# Patient Record
Sex: Female | Born: 1935 | Race: White | Hispanic: No | Marital: Single | Smoking: Former smoker
Health system: Southern US, Community
[De-identification: ages and names within clinical notes are randomized; demographics above are authoritative.]

## PROBLEM LIST (undated history)

## (undated) DIAGNOSIS — R609 Edema, unspecified: Secondary | ICD-10-CM

## (undated) DIAGNOSIS — I10 Essential (primary) hypertension: Secondary | ICD-10-CM

## (undated) DIAGNOSIS — J189 Pneumonia, unspecified organism: Secondary | ICD-10-CM

## (undated) DIAGNOSIS — R600 Localized edema: Secondary | ICD-10-CM

## (undated) DIAGNOSIS — I4891 Unspecified atrial fibrillation: Secondary | ICD-10-CM

## (undated) DIAGNOSIS — F0390 Unspecified dementia without behavioral disturbance: Secondary | ICD-10-CM

## (undated) HISTORY — PX: ABDOMINAL HYSTERECTOMY: SHX81

## (undated) HISTORY — PX: CHOLECYSTECTOMY: SHX55

## (undated) HISTORY — PX: EYE SURGERY: SHX253

## (undated) HISTORY — PX: BREAST SURGERY: SHX581

## (undated) HISTORY — PX: APPENDECTOMY: SHX54

## (undated) HISTORY — PX: FRACTURE SURGERY: SHX138

---

## 2004-06-23 ENCOUNTER — Other Ambulatory Visit: Payer: Self-pay

## 2006-07-15 ENCOUNTER — Ambulatory Visit: Payer: Self-pay | Admitting: Internal Medicine

## 2008-08-15 ENCOUNTER — Inpatient Hospital Stay: Payer: Self-pay | Admitting: Internal Medicine

## 2009-03-01 ENCOUNTER — Inpatient Hospital Stay: Payer: Self-pay | Admitting: Internal Medicine

## 2010-03-14 ENCOUNTER — Inpatient Hospital Stay: Payer: Self-pay | Admitting: Orthopedic Surgery

## 2010-03-20 ENCOUNTER — Encounter: Payer: Self-pay | Admitting: Internal Medicine

## 2010-04-13 ENCOUNTER — Encounter: Payer: Self-pay | Admitting: Internal Medicine

## 2010-05-14 ENCOUNTER — Encounter: Payer: Self-pay | Admitting: Internal Medicine

## 2010-06-14 ENCOUNTER — Encounter: Payer: Self-pay | Admitting: Internal Medicine

## 2010-07-14 ENCOUNTER — Encounter: Payer: Self-pay | Admitting: Internal Medicine

## 2010-08-14 ENCOUNTER — Ambulatory Visit: Payer: Self-pay

## 2010-10-25 ENCOUNTER — Ambulatory Visit: Payer: Self-pay | Admitting: Orthopedic Surgery

## 2010-10-30 ENCOUNTER — Inpatient Hospital Stay: Payer: Self-pay | Admitting: Orthopedic Surgery

## 2010-11-02 ENCOUNTER — Encounter: Payer: Self-pay | Admitting: Internal Medicine

## 2010-11-14 ENCOUNTER — Encounter: Payer: Self-pay | Admitting: Internal Medicine

## 2010-11-14 ENCOUNTER — Inpatient Hospital Stay: Payer: Self-pay | Admitting: Surgery

## 2010-11-14 ENCOUNTER — Ambulatory Visit: Payer: Self-pay

## 2010-11-19 ENCOUNTER — Encounter: Payer: Self-pay | Admitting: Internal Medicine

## 2010-12-13 ENCOUNTER — Encounter: Payer: Self-pay | Admitting: Internal Medicine

## 2010-12-16 ENCOUNTER — Emergency Department: Payer: Self-pay | Admitting: Emergency Medicine

## 2011-01-13 ENCOUNTER — Encounter: Payer: Self-pay | Admitting: Internal Medicine

## 2011-02-12 ENCOUNTER — Encounter: Payer: Self-pay | Admitting: Internal Medicine

## 2011-03-15 ENCOUNTER — Encounter: Payer: Self-pay | Admitting: Internal Medicine

## 2011-04-14 ENCOUNTER — Encounter: Payer: Self-pay | Admitting: Internal Medicine

## 2011-05-15 ENCOUNTER — Encounter: Payer: Self-pay | Admitting: Internal Medicine

## 2011-10-28 ENCOUNTER — Emergency Department: Payer: Self-pay | Admitting: *Deleted

## 2011-10-28 LAB — COMPREHENSIVE METABOLIC PANEL
Anion Gap: 11 (ref 7–16)
BUN: 18 mg/dL (ref 7–18)
Bilirubin,Total: 0.4 mg/dL (ref 0.2–1.0)
Chloride: 97 mmol/L — ABNORMAL LOW (ref 98–107)
Creatinine: 0.84 mg/dL (ref 0.60–1.30)
EGFR (Non-African Amer.): >60
Potassium: 4.4 mmol/L (ref 3.5–5.1)
Sodium: 137 mmol/L (ref 136–145)
Total Protein: 7.7 g/dL (ref 6.4–8.2)

## 2011-10-28 LAB — CBC
HCT: 41 % (ref 35.0–47.0)
MCH: 31.2 pg (ref 26.0–34.0)
MCHC: 33.2 g/dL (ref 32.0–36.0)
Platelet: 226 10*3/uL (ref 150–440)
RDW: 13.5 % (ref 11.5–14.5)
WBC: 8.8 10*3/uL (ref 3.6–11.0)

## 2011-10-28 LAB — URINALYSIS, COMPLETE
Bilirubin,UR: NEGATIVE
Ph: 6 (ref 4.5–8.0)
Protein: NEGATIVE
RBC,UR: 2 /HPF (ref 0–5)
Squamous Epithelial: 6

## 2011-10-28 LAB — LIPASE, BLOOD: Lipase: 141 U/L (ref 73–393)

## 2013-10-15 LAB — COMPREHENSIVE METABOLIC PANEL
ALBUMIN: 2.7 g/dL — AB (ref 3.4–5.0)
ALK PHOS: 51 U/L
Anion Gap: 6 — ABNORMAL LOW (ref 7–16)
BUN: 17 mg/dL (ref 7–18)
Bilirubin,Total: 0.9 mg/dL (ref 0.2–1.0)
CHLORIDE: 97 mmol/L — AB (ref 98–107)
CREATININE: 0.72 mg/dL (ref 0.60–1.30)
Calcium, Total: 8.5 mg/dL (ref 8.5–10.1)
Co2: 27 mmol/L (ref 21–32)
EGFR (African American): >60
GLUCOSE: 100 mg/dL — AB (ref 65–99)
OSMOLALITY: 262 (ref 275–301)
Potassium: 3.8 mmol/L (ref 3.5–5.1)
SGOT(AST): 44 U/L — ABNORMAL HIGH (ref 15–37)
SGPT (ALT): 22 U/L (ref 12–78)
Sodium: 130 mmol/L — ABNORMAL LOW (ref 136–145)
Total Protein: 6.9 g/dL (ref 6.4–8.2)

## 2013-10-15 LAB — MAGNESIUM: MAGNESIUM: 1.8 mg/dL

## 2013-10-15 LAB — LIPASE, BLOOD: LIPASE: 68 U/L — AB (ref 73–393)

## 2013-10-15 LAB — TROPONIN I: Troponin-I: <0.02 ng/mL

## 2013-10-16 ENCOUNTER — Inpatient Hospital Stay: Payer: Self-pay | Admitting: Internal Medicine

## 2013-10-16 LAB — URINALYSIS, COMPLETE
Bilirubin,UR: NEGATIVE
Glucose,UR: NEGATIVE mg/dL (ref 0–75)
KETONE: NEGATIVE
Leukocyte Esterase: NEGATIVE
Nitrite: NEGATIVE
Ph: 6 (ref 4.5–8.0)
Protein: 30
RBC,UR: 68 /HPF (ref 0–5)
Specific Gravity: 1.023 (ref 1.003–1.030)
Squamous Epithelial: 1
WBC UR: 3 /HPF (ref 0–5)

## 2013-10-16 LAB — CBC WITH DIFFERENTIAL/PLATELET
Basophil #: 0.1 10*3/uL (ref 0.0–0.1)
Basophil %: 0.5 %
EOS ABS: 0.1 10*3/uL (ref 0.0–0.7)
EOS PCT: 0.9 %
HCT: 32.6 % — AB (ref 35.0–47.0)
HGB: 11 g/dL — AB (ref 12.0–16.0)
LYMPHS ABS: 1.6 10*3/uL (ref 1.0–3.6)
Lymphocyte %: 11.8 %
MCH: 30.6 pg (ref 26.0–34.0)
MCHC: 33.8 g/dL (ref 32.0–36.0)
MCV: 91 fL (ref 80–100)
MONOS PCT: 12.1 %
Monocyte #: 1.6 x10 3/mm — ABNORMAL HIGH (ref 0.2–0.9)
NEUTROS ABS: 9.9 10*3/uL — AB (ref 1.4–6.5)
Neutrophil %: 74.7 %
PLATELETS: 229 10*3/uL (ref 150–440)
RBC: 3.6 10*6/uL — ABNORMAL LOW (ref 3.80–5.20)
RDW: 13.9 % (ref 11.5–14.5)
WBC: 13.2 10*3/uL — AB (ref 3.6–11.0)

## 2013-10-16 LAB — PROTIME-INR
INR: 1.2
Prothrombin Time: 14.6 secs (ref 11.5–14.7)

## 2013-10-17 LAB — CBC WITH DIFFERENTIAL/PLATELET
BASOS PCT: 0.4 %
Basophil #: 0.1 10*3/uL (ref 0.0–0.1)
EOS ABS: 0.2 10*3/uL (ref 0.0–0.7)
Eosinophil %: 1.9 %
HCT: 32.5 % — AB (ref 35.0–47.0)
HGB: 10.8 g/dL — AB (ref 12.0–16.0)
LYMPHS ABS: 1.3 10*3/uL (ref 1.0–3.6)
Lymphocyte %: 11.4 %
MCH: 30.3 pg (ref 26.0–34.0)
MCHC: 33.3 g/dL (ref 32.0–36.0)
MCV: 91 fL (ref 80–100)
Monocyte #: 1.2 x10 3/mm — ABNORMAL HIGH (ref 0.2–0.9)
Monocyte %: 10.5 %
Neutrophil #: 8.8 10*3/uL — ABNORMAL HIGH (ref 1.4–6.5)
Neutrophil %: 75.8 %
Platelet: 260 10*3/uL (ref 150–440)
RBC: 3.57 10*6/uL — AB (ref 3.80–5.20)
RDW: 14.3 % (ref 11.5–14.5)
WBC: 11.6 10*3/uL — AB (ref 3.6–11.0)

## 2013-10-17 LAB — COMPREHENSIVE METABOLIC PANEL
AST: 22 U/L (ref 15–37)
Albumin: 2.5 g/dL — ABNORMAL LOW (ref 3.4–5.0)
Alkaline Phosphatase: 50 U/L
Anion Gap: 3 — ABNORMAL LOW (ref 7–16)
BUN: 11 mg/dL (ref 7–18)
Bilirubin,Total: 0.6 mg/dL (ref 0.2–1.0)
Calcium, Total: 8.1 mg/dL — ABNORMAL LOW (ref 8.5–10.1)
Chloride: 102 mmol/L (ref 98–107)
Co2: 27 mmol/L (ref 21–32)
Creatinine: 0.77 mg/dL (ref 0.60–1.30)
EGFR (African American): >60
EGFR (Non-African Amer.): >60
Glucose: 99 mg/dL (ref 65–99)
Osmolality: 264 (ref 275–301)
Potassium: 3 mmol/L — ABNORMAL LOW (ref 3.5–5.1)
SGPT (ALT): 20 U/L (ref 12–78)
SODIUM: 132 mmol/L — AB (ref 136–145)
TOTAL PROTEIN: 6.4 g/dL (ref 6.4–8.2)

## 2013-10-18 LAB — ELECTROLYTE PANEL
Anion Gap: 9 (ref 7–16)
CO2: 20 mmol/L — AB (ref 21–32)
Chloride: 103 mmol/L (ref 98–107)
POTASSIUM: 3.9 mmol/L (ref 3.5–5.1)
Sodium: 132 mmol/L — ABNORMAL LOW (ref 136–145)

## 2013-10-19 LAB — ELECTROLYTE PANEL
Anion Gap: 6 — ABNORMAL LOW (ref 7–16)
CHLORIDE: 101 mmol/L (ref 98–107)
CO2: 26 mmol/L (ref 21–32)
Potassium: 3 mmol/L — ABNORMAL LOW (ref 3.5–5.1)
SODIUM: 133 mmol/L — AB (ref 136–145)

## 2013-10-20 LAB — CREATININE, SERUM
Creatinine: 0.71 mg/dL (ref 0.60–1.30)
EGFR (Non-African Amer.): >60

## 2013-10-20 LAB — ELECTROLYTE PANEL
Anion Gap: 6 — ABNORMAL LOW (ref 7–16)
CO2: 26 mmol/L (ref 21–32)
Chloride: 103 mmol/L (ref 98–107)
Potassium: 3.6 mmol/L (ref 3.5–5.1)
Sodium: 135 mmol/L — ABNORMAL LOW (ref 136–145)

## 2013-10-20 LAB — WBC: WBC: 12.9 10*3/uL — ABNORMAL HIGH (ref 3.6–11.0)

## 2013-10-20 LAB — PLATELET COUNT: PLATELETS: 310 10*3/uL (ref 150–440)

## 2013-10-21 LAB — CULTURE, BLOOD (SINGLE)

## 2013-10-21 LAB — ELECTROLYTE PANEL
Anion Gap: 6 — ABNORMAL LOW (ref 7–16)
CO2: 28 mmol/L (ref 21–32)
Chloride: 102 mmol/L (ref 98–107)
Potassium: 3.5 mmol/L (ref 3.5–5.1)
Sodium: 136 mmol/L (ref 136–145)

## 2015-02-04 NOTE — H&P (Signed)
PATIENT NAME:  Shannon Williams, Shannon Williams MR#:  [id number] DATE OF BIRTH:  Feb 26, 1922  DATE OF ADMISSION:  10/16/2013  REFERRING PHYSICIAN: Dr.  York Cerise.   PRIMARY CARE PHYSICIAN: Dr. Yates Decamp.   CHIEF COMPLAINT: Altered mental status.  HISTORY OF PRESENT ILLNESS:  This is an 81 year old Caucasian female with past medical history of atrial fibrillation as well as dementia, presenting with altered mental status. Her family describes her altered mental status described as lethargy and decreased responsiveness. Apparently, her symptoms began approximately one week ago with a cough productive of yellow sputum; however, no fevers or chills. Conversation with PCP, she should receive some antitussives medication, however, after receiving these medications, her mental status deteriorated and made her drowsy. The family says this is not an uncommon finding for this patient. She has had multiple instances where she received medications and became minimally responsive, however, after the symptoms persisted for about two days in duration, they were advised to bring her to the hospital for further workup and evaluation. The patient is unable to provide further information secondary to medical condition and mental status.   REVIEW OF SYSTEMS: Unobtainable secondary to medical condition and mental status.   PAST MEDICAL HISTORY:  Of dementia, according to family, at baseline, atrial fibrillation, hypertension, hyperlipidemia.   SOCIAL HISTORY: No alcohol, tobacco or drug usage. She has been bedridden for approximately three years in total, baseline mental status. She is able to carry on conversations without difficulties. Occasional minimal confusion.   FAMILY HISTORY: Positive for coronary artery disease. Denies any known neurological disorders.   ALLERGIES: BETA BLOCKERS, CODEINE, ERYTHROMYCIN, AS WELL AS MORPHINE.   HOME MEDICATIONS: Aspirin 325 mg p.o. daily, calcium 500 + D 1 tablet p.o. daily, citalopram 10 mg  p.o. daily, diltiazem 120 mg extended release p.o. daily, Colace 50 mg daily as needed for constipation, Lasix 20 mg p.o. daily, Telmisartan 40 mg p.o. daily.   PHYSICAL EXAMINATION: VITAL SIGNS: Temperature 99.3 heart rate 132, respirations 22, blood pressure 120/67, saturating 93% on supplemental O2. Weight 77.1 kg, BMI 27.5.  GENERAL: Chronically ill-appearing Caucasian female, currently in minimal to moderate distress given mental status.  HEAD: Normocephalic, atraumatic.  EYES: Pupils equal, round and reactive to light. Extraocular muscles intact. No scleral icterus.  MOUTH: Dry mucosal membrane. Poor dentition. No abscess noted.  EARS, NOSE, THROAT: Throat clear without exudates. No external lesions.  NECK: Supple. No thyromegaly. No nodules. No JVD.  PULMONARY: Scant coarse rhonchi over the left with decreased breath sounds bilaterally. No use of accessory muscles. Good respiratory effort.  CHEST: Nontender to palpation.  CARDIOVASCULAR: S1, S2, irregular rate, irregular rhythm, tachycardic. No murmurs, rubs or gallops 2+ edema to the thighs bilaterally. Pedal pulses 2+ bilaterally.  GASTROINTESTINAL: Soft, nontender, nondistended. No masses. Positive bowel sounds. No hepatosplenomegaly.  MUSCULOSKELETAL: No swelling, clubbing, positive for edema as described above. Passive range of motion full in all extremities.  NEUROLOGIC: Unable to fully assess given the patient's medical condition and mental status.  SKIN: Bilateral medial portion of the lower extremities around the knees reveal multiple excoriations with bullae as well as surrounding erythema and edema. No further ulcerations, lesions, rash or cyanosis. Skin is warm and dry. Skin turgor is intact.  PSYCHIATRIC: Blunted, unable to fully assess, given the patient's medical condition and mental status. She occasionally moans, but does not provide any meaningful information.   LABORATORY DATA: Sodium 130, potassium 3.8, chloride 97,  bicarb 27, BUN 17, creatinine 0.72, glucose 100. LFTs: Albumin of 2.7,  remainder normal limits. WBC 13.2, neutrophil predominant at 9.9, hemoglobin 11, platelets 229. Urinalysis, WBCs 3, RBCs 68. Otherwise, no indication of infection. Chest x-ray performed revealing retrocardiac opacity.   ASSESSMENT AND PLAN: An 81 year old female with history of atrial fibrillation and dementia, presenting with altered mental status, described as lethargy, found to be in atrial fibrillation with ventricular response as well as retrocardiac pneumonia.  1.  Acute encephalopathy, avoid further sedating agents. This is likely secondary to medication side effects further complicated by her acute illness.  2.  Sepsis, meeting septic criteria via white count and heart rate. Evidence points to pulmonary source: Pan cultures and antibiotic coverage with Levaquin and IV fluids to maintain mean arterial pressure greater than 65. Provide DuoNeb therapy q.4 hours as well as supplemental O2 to maintain SAO2 greater than 92%.  3.  Atrial fibrillation, rapid ventricular response. She did miss her Cardizem doses for about two days in total, though she is unable to tolerate p.o. at this time. We will start Cardizem drip, give 10 mg IV x 1 and start drip at 5 mg an hour for rate control and adjust accordingly and bolus in increased fashion if necessary.  4.  Deep venous thrombosis prophylaxis. She is on heparin subcutaneous.  5.  CODE STATUS: The patient is a DO NOT RESUSCITATE per her daughters at bedside, however, she will need Intensive Care Unit bed given Cardizem drip.   Critical care time spent: 55 minutes.     ____________________________ Cletis Athensavid K. Hower, MD dkh:NTS D: 10/16/2013 01:55:50 ET T: 10/16/2013 02:54:00 ET JOB#: [id number]  cc: Cletis Athensavid K. Hower, MD, <Dictator> DAVID Synetta ShadowK HOWER MD ELECTRONICALLY SIGNED 10/16/2013 20:20

## 2015-02-04 NOTE — Discharge Summary (Signed)
PATIENT NAME:  Shannon Williams, Shannon Williams MR#:  [id number] DATE OF BIRTH:  09-16-1922  DATE OF ADMISSION:  10/16/2013 DATE OF DISCHARGE:  10/22/2013  Shannon Williams was an 81 year old, demented lady brought in by her daughters because of altered mental status, progressive shortness of breath, cough and fever.   PAST MEDICAL HISTORY: Notable for senile dementia, chronic atrial fibrillation, hypertension and hyperlipidemia.   ALLERGIES: THE PATIENT HAS A HISTORY OF INTOLERANCE TO BETA BLOCKERS, CODEINE, ERYTHROMYCIN AND MORPHINE.   MEDICATIONS ON ADMISSION: Included aspirin 325 mg daily, calcium 500 mg plus vitamin D 1 tablet daily, Celexa 10 mg daily, diltiazem extended-release 120 mg daily, Colace 50 mg daily, Lasix 20 mg daily and telmisartan 40 mg daily.   ADMISSION PHYSICAL EXAMINATION: Revealed: VITAL SIGNS: Temperature 99.3, heart rate of 132, respirations 22 and blood pressure 120/67. Pulse oximetry on oxygen was 93%.  GENERAL: The patient looked both acutely and chronically ill. She was in moderate respiratory distress.  HEENT: She had dry mucous membranes and poor dentition.  LUNGS: She had decreased breath sounds bilaterally. Rhonchi were heard over the left lung field.  HEART: Revealed an irregular tachycardia without murmurs or gallops. She did have 2+ edema. EXTREMITIES: The patient had stasis cellulitis of the lower extremities.  LABS: Admission CBC showed a hemoglobin of 11 with a hematocrit of 32.6. Platelet count was 229,000. White count was 13,200. Admission comprehensive metabolic panel was notable for a sodium of 130 and a chloride of 97. SGOT was 44. Albumin was 2.7. Lipase was 68. Magnesium was 1.8. Troponin was less than 0.02. Urinalysis on admission showed 2+ blood on the dipstick plus 30 mg/dL of protein. Microscopic exam showed 68 red cells per high-power field. Blood cultures drawn on admission eventually showed no growth. Admission EKG showed atrial fibrillation with a rapid  ventricular response. Admission chest x-ray showed a retrocardiac density that was felt most likely to be pneumonia. There was also peribronchial thickening bilaterally.   HOSPITAL COURSE: The patient was admitted to the regular medical floor where she was placed on telemetry. She was placed on a pulmonary toilet of IV steroids, IV antibiotics and SVNs. Her atrial fibrillation was felt to be due to the fact she has missed her medication prior to admission for at least 2 days. She was initially started on a Cardizem drip, but was switched to oral Cardizem as soon as possible. The patient did show slow, but gradual improvement. Her heart rate was brought under control. Her final chemistry panel showed a sodium of 136 with a potassium of 3.5 and a chloride of 102. Followup chest x-ray showed a small left pleural effusion. There was left interstitial thickening with left basilar airspace disease. This was felt to be persistent pneumonia versus atelectasis. The patient was seen by physical therapy, who tried to increase her strength during her hospital stay.   DISCHARGE DIAGNOSES:  1.  Sepsis due to acute community-acquired pneumonia.  2.  Rapid atrial fibrillation, resolved.  3.  Hypertension.  4.  Senile dementia.  5.  Microscopic hematuria.   DISCHARGE MEDICATIONS:  1.  Telmisartan 40 mg daily.  2.  Furosemide 20 mg daily.  3.  Celexa 10 mg daily.  4.  Tessalon Perles 100 mg 3 times a day as needed.  5.  Colace 50 mg daily as needed.  6.  Aspirin 325 mg daily.  7.  Diltiazem extended release 240 mg daily.  8.  Levaquin 500 mg once a day for an additional 7 days.  9.  Calcium 500 mg plus vitamin D 1 tablet daily.   DISCHARGE INSTRUCTIONS: The patient was discharged on a low sodium diet with activity as tolerated. The patient's pulse oximetry at the time of discharge was normal and she did not require home oxygen. The patient is to be followed up in the office in 1 to 2 weeks.   ____________________________ Letta PateJohn B. Danne HarborWalker III, MD jbw:aw D: 11/08/2013 07:38:14 ET T: 11/08/2013 07:53:45 ET JOB#: [id number]  cc: Jonny RuizJohn B. Danne HarborWalker III, MD, <Dictator> Elmo PuttJOHN B WALKER III MD ELECTRONICALLY SIGNED 11/08/2013 18:44

## 2015-02-26 ENCOUNTER — Emergency Department: Payer: Commercial Managed Care - HMO

## 2015-02-26 ENCOUNTER — Inpatient Hospital Stay
Admission: EM | Admit: 2015-02-26 | Discharge: 2015-03-01 | DRG: 689 | Disposition: A | Discharge status: Skilled Nursing Facility | Payer: Commercial Managed Care - HMO | Attending: Internal Medicine | Admitting: Internal Medicine

## 2015-02-26 ENCOUNTER — Other Ambulatory Visit: Payer: Self-pay

## 2015-02-26 ENCOUNTER — Encounter: Payer: Self-pay | Admitting: Emergency Medicine

## 2015-02-26 DIAGNOSIS — Z7401 Bed confinement status: Secondary | ICD-10-CM | POA: Diagnosis not present

## 2015-02-26 DIAGNOSIS — Z7982 Long term (current) use of aspirin: Secondary | ICD-10-CM | POA: Diagnosis not present

## 2015-02-26 DIAGNOSIS — I482 Chronic atrial fibrillation, unspecified: Secondary | ICD-10-CM | POA: Diagnosis present

## 2015-02-26 DIAGNOSIS — R4182 Altered mental status, unspecified: Secondary | ICD-10-CM

## 2015-02-26 DIAGNOSIS — I10 Essential (primary) hypertension: Secondary | ICD-10-CM | POA: Diagnosis present

## 2015-02-26 DIAGNOSIS — G9340 Encephalopathy, unspecified: Secondary | ICD-10-CM | POA: Diagnosis present

## 2015-02-26 DIAGNOSIS — I4891 Unspecified atrial fibrillation: Secondary | ICD-10-CM

## 2015-02-26 DIAGNOSIS — Z87891 Personal history of nicotine dependence: Secondary | ICD-10-CM | POA: Diagnosis not present

## 2015-02-26 DIAGNOSIS — N390 Urinary tract infection, site not specified: Secondary | ICD-10-CM | POA: Diagnosis not present

## 2015-02-26 DIAGNOSIS — Z66 Do not resuscitate: Secondary | ICD-10-CM | POA: Diagnosis present

## 2015-02-26 DIAGNOSIS — Z885 Allergy status to narcotic agent status: Secondary | ICD-10-CM | POA: Diagnosis not present

## 2015-02-26 DIAGNOSIS — A419 Sepsis, unspecified organism: Principal | ICD-10-CM | POA: Diagnosis present

## 2015-02-26 DIAGNOSIS — Z79899 Other long term (current) drug therapy: Secondary | ICD-10-CM

## 2015-02-26 DIAGNOSIS — F0390 Unspecified dementia without behavioral disturbance: Secondary | ICD-10-CM | POA: Diagnosis present

## 2015-02-26 DIAGNOSIS — Z882 Allergy status to sulfonamides status: Secondary | ICD-10-CM

## 2015-02-26 DIAGNOSIS — I739 Peripheral vascular disease, unspecified: Secondary | ICD-10-CM | POA: Diagnosis present

## 2015-02-26 HISTORY — DX: Localized edema: R60.0

## 2015-02-26 HISTORY — DX: Essential (primary) hypertension: I10

## 2015-02-26 HISTORY — DX: Unspecified atrial fibrillation: I48.91

## 2015-02-26 HISTORY — DX: Edema, unspecified: R60.9

## 2015-02-26 HISTORY — DX: Unspecified dementia, unspecified severity, without behavioral disturbance, psychotic disturbance, mood disturbance, and anxiety: F03.90

## 2015-02-26 HISTORY — DX: Pneumonia, unspecified organism: J18.9

## 2015-02-26 LAB — CBC WITH DIFFERENTIAL/PLATELET
Basophils Absolute: 0.1 10*3/uL (ref 0–0.1)
Basophils Relative: 1 %
Eosinophils Absolute: 0 10*3/uL (ref 0–0.7)
Eosinophils Relative: 0 %
HEMATOCRIT: 37.4 % (ref 35.0–47.0)
Hemoglobin: 12.4 g/dL (ref 12.0–16.0)
LYMPHS ABS: 1.8 10*3/uL (ref 1.0–3.6)
Lymphocytes Relative: 8 %
MCH: 30.6 pg (ref 26.0–34.0)
MCHC: 33.3 g/dL (ref 32.0–36.0)
MCV: 91.8 fL (ref 80.0–100.0)
Monocytes Absolute: 1.4 10*3/uL — ABNORMAL HIGH (ref 0.2–0.9)
Monocytes Relative: 6 %
NEUTROS ABS: 18.2 10*3/uL — AB (ref 1.4–6.5)
NEUTROS PCT: 85 %
PLATELETS: 250 10*3/uL (ref 150–440)
RBC: 4.07 MIL/uL (ref 3.80–5.20)
RDW: 13.8 % (ref 11.5–14.5)
WBC: 21.4 10*3/uL — ABNORMAL HIGH (ref 3.6–11.0)

## 2015-02-26 LAB — URINALYSIS COMPLETE WITH MICROSCOPIC (ARMC ONLY)
Bilirubin Urine: NEGATIVE
Glucose, UA: NEGATIVE mg/dL
KETONES UR: NEGATIVE mg/dL
Leukocytes, UA: NEGATIVE
Nitrite: NEGATIVE
Protein, ur: 30 mg/dL — AB
Specific Gravity, Urine: 1.015 (ref 1.005–1.030)
pH: 6 (ref 5.0–8.0)

## 2015-02-26 LAB — COMPREHENSIVE METABOLIC PANEL
ALBUMIN: 3.6 g/dL (ref 3.5–5.0)
ALT: 13 U/L — AB (ref 14–54)
AST: 21 U/L (ref 15–41)
Alkaline Phosphatase: 48 U/L (ref 38–126)
Anion gap: 10 (ref 5–15)
BUN: 14 mg/dL (ref 6–20)
CHLORIDE: 98 mmol/L — AB (ref 101–111)
CO2: 28 mmol/L (ref 22–32)
Calcium: 9.3 mg/dL (ref 8.9–10.3)
Creatinine, Ser: 0.67 mg/dL (ref 0.44–1.00)
GFR calc non Af Amer: >60 mL/min (ref >60)
Glucose, Bld: 127 mg/dL — ABNORMAL HIGH (ref 65–99)
Potassium: 3.5 mmol/L (ref 3.5–5.1)
SODIUM: 136 mmol/L (ref 135–145)
Total Bilirubin: 0.6 mg/dL (ref 0.3–1.2)
Total Protein: 7.2 g/dL (ref 6.5–8.1)

## 2015-02-26 LAB — TROPONIN I

## 2015-02-26 LAB — LACTIC ACID, PLASMA: LACTIC ACID, VENOUS: 1.6 mmol/L (ref 0.5–2.0)

## 2015-02-26 MED ORDER — DEXTROSE 5 % IV SOLN
INTRAVENOUS | Status: AC
  Administered 2015-02-26: 1 g via INTRAVENOUS
  Filled 2015-02-26: qty 10

## 2015-02-26 MED ORDER — DILTIAZEM HCL 25 MG/5ML IV SOLN
INTRAVENOUS | Status: AC
  Administered 2015-02-26: 10 mg via INTRAVENOUS
  Filled 2015-02-26: qty 5

## 2015-02-26 MED ORDER — SODIUM CHLORIDE 0.9 % IV SOLN
Freq: Once | INTRAVENOUS | Status: AC
  Administered 2015-02-26: 21:00:00 via INTRAVENOUS

## 2015-02-26 MED ORDER — DILTIAZEM HCL 25 MG/5ML IV SOLN
10.0000 mg | Freq: Once | INTRAVENOUS | Status: AC
Start: 2015-02-26 — End: 2015-02-26
  Administered 2015-02-26: 10 mg via INTRAVENOUS

## 2015-02-26 MED ORDER — DILTIAZEM HCL 25 MG/5ML IV SOLN
20.0000 mg | Freq: Once | INTRAVENOUS | Status: AC
  Administered 2015-02-26: 20 mg via INTRAVENOUS

## 2015-02-26 MED ORDER — DILTIAZEM HCL ER COATED BEADS 240 MG PO CP24
240.0000 mg | ORAL_CAPSULE | Freq: Once | ORAL | Status: DC
  Filled 2015-02-26 (×2): qty 1

## 2015-02-26 MED ORDER — DILTIAZEM HCL 25 MG/5ML IV SOLN
INTRAVENOUS | Status: AC
  Administered 2015-02-26: 20 mg via INTRAVENOUS
  Filled 2015-02-26: qty 5

## 2015-02-26 MED ORDER — CEFTRIAXONE SODIUM 1 G IJ SOLR
1.0000 g | Freq: Once | INTRAMUSCULAR | Status: AC
  Administered 2015-02-26: 1 g via INTRAVENOUS

## 2015-02-26 NOTE — ED Notes (Signed)
Pt to rm 5 from home via ems.  Per EMS reports to with noted increased confusion this am at approx 7am.  Report pt with hx of UTI's.  Also report that earlier in the week pt experienced similar symptoms, pt assessed by home health nurse, pt was not transported to ED family decided it was a TIA.

## 2015-02-26 NOTE — ED Notes (Signed)
Family at bedside. Portable x-ray done.

## 2015-02-26 NOTE — H&P (Signed)
[facility] [place] [facility]   PATIENT NAME: Shannon RileyMargie Williams    MR#:  [id number]  DATE OF BIRTH:  05/11/1922  DATE OF ADMISSION:  02/26/2015  PRIMARY CARE PHYSICIAN: Rafael BihariWALKER III, [name], MD   REQUESTING/REFERRING PHYSICIAN: Daryel NovemberJonathan Williams  CHIEF COMPLAINT:   Chief Complaint  Patient presents with  . Altered Mental Status    HISTORY OF PRESENT ILLNESS:  Shannon Williams  is a 81 y.o. female with a known history of chronic atrial fibrillation, hypertension, dementia presents to the emergency room via EMS with the complaints of altered mental status since this morning. Patient is chronically bedbound and lives with her daughter at home and today morning she was noted to have increased confusion, hence brought to the emergency room for further evaluation. Denies any fever, chest pain, shortness of breath, focal weakness or numbness, nausea, vomiting, diarrhea, abdominal pain, dysuria. In the emergency room patient was evaluated by the ED physician and was noted to be in atrial fibrillation with a rapid ventricular rate of 142 bpm. Her blood pressure was stable. Lab work revealed significant leukocytosis with a WBC count of 21.4 with left shift, abnormal UA suggestive of UTI. Chest x-ray unremarkable for any acute cardiopulmonary pathology. CT head unremarkable for any acute intracranial process. Patient was given IV Cardizem 2 and at present her heart rate is still elevated but improving control. After sending the urine cultures patient was started on IV antibiotics namely ceftriaxone and hospitalist service was consulted for further management. Patient is on DO NOT RESUSCITATE status which is confirmed with her daughter who is her bedside at this time. Per patient's daughter, patient is more alert with improving mental status now. Does of her baseline dementia, I'm not able to get any history from the patient but according to the patient's daughter who is with the  patient's bedside at this time no history of fever or chills, chest pain,shortness of breath, focal weakness or numbness, nausea, vomiting, diarrhea, abdominal pain, dysuria. No history of recent reported illness.  PAST MEDICAL HISTORY:   Past Medical History  Diagnosis Date  . Atrial fibrillation   . Peripheral edema   . Hypertension   . Dementia     PAST SURGICAL HISTORY:   Past Surgical History  Procedure Laterality Date  . Cholecystectomy    . Abdominal hysterectomy    . Eye surgery    . Breast surgery    . Appendectomy    . Fracture surgery      SOCIAL HISTORY:   History  Substance Use Topics  . Smoking status: Former Games developermoker  . Smokeless tobacco: Not on file  . Alcohol Use: No    FAMILY HISTORY:   Family History  Problem Relation Age of Onset  . Heart Problems Mother   . Cancer Father   . Cancer - Lung Sister     DRUG ALLERGIES:   Allergies  Allergen Reactions  . Sulfa Antibiotics Itching  . Codeine Anxiety    REVIEW OF SYSTEMS:   Review of Systems  Unable to perform ROS: dementia    MEDICATIONS AT HOME:   Prior to Admission medications   Medication Sig Start Date End Date Taking? Authorizing Provider  acetaminophen (TYLENOL) 500 MG tablet Take 500 mg by mouth every 6 (six) hours as needed.   Yes Historical Provider, MD  aspirin 325 MG tablet Take 325 mg by mouth daily.   Yes Historical Provider, MD  calcium-vitamin D (OSCAL WITH D) 500-200 MG-UNIT  per tablet Take 1 tablet by mouth.   Yes Historical Provider, MD  citalopram (CELEXA) 10 MG tablet Take 10 mg by mouth daily.   Yes Historical Provider, MD  diltiazem (TIAZAC) 240 MG 24 hr capsule Take 240 mg by mouth daily.   Yes Historical Provider, MD  docusate sodium (COLACE) 50 MG capsule Take 50 mg by mouth 2 (two) times daily.   Yes Historical Provider, MD  furosemide (LASIX) 20 MG tablet Take 20 mg by mouth.   Yes Historical Provider, MD  losartan (COZAAR) 100 MG tablet Take 100 mg by mouth  daily.   Yes Historical Provider, MD      VITAL SIGNS:  Blood pressure 120/72, pulse 123, temperature 98.1 F (36.7 C), resp. rate 22, height  (1.6 m), weight 72.576 kg (160 lb), SpO2 95 %.  PHYSICAL EXAMINATION:  Physical Exam  Constitutional: She appears well-developed and well-nourished. No distress.  HENT:  Head: Normocephalic and atraumatic.  Right Ear: External ear normal.  Left Ear: External ear normal.  Nose: Nose normal.  Mouth/Throat: Oropharynx is clear and moist. No oropharyngeal exudate.  Eyes: Pupils are equal, round, and reactive to light. No scleral icterus.  Sunken eyeballs bilaterally.  Neck: Normal range of motion. Neck supple. No JVD present. No thyromegaly present.  Cardiovascular: Normal rate, normal heart sounds and intact distal pulses.   No murmur heard. Irregularly irregular heart rhythm  Respiratory: Effort normal and breath sounds normal. No respiratory distress. She has no wheezes. She has no rales. She exhibits no tenderness.  GI: Soft. Bowel sounds are normal. She exhibits no distension and no mass. There is no tenderness. There is no rebound and no guarding.  Musculoskeletal: Normal range of motion. She exhibits no edema.  Lymphadenopathy:    She has no cervical adenopathy.  Neurological: She is alert. She displays normal reflexes. No cranial nerve deficit. She exhibits normal muscle tone.  Moving all 4 limbs well  Skin: Skin is warm. No rash noted.  Psychiatric: She has a normal mood and affect. Her behavior is normal. Thought content normal.   LABORATORY PANEL:   CBC  Recent Labs Lab 02/26/15 2031  WBC 21.4*  HGB 12.4  HCT 37.4  PLT 250   ------------------------------------------------------------------------------------------------------------------  Chemistries   Recent Labs Lab 02/26/15 2031  NA 136  K 3.5  CL 98*  CO2 28  GLUCOSE 127*  BUN 14  CREATININE 0.67  CALCIUM 9.3  AST 21  ALT 13*  ALKPHOS 48  BILITOT  0.6   ------------------------------------------------------------------------------------------------------------------  Cardiac Enzymes  Recent Labs Lab 02/26/15 2031  TROPONINI <0.03   ------------------------------------------------------------------------------------------------------------------  RADIOLOGY:  Dg Chest 1 View  02/26/2015   CLINICAL DATA:  Altered mental status.  EXAM: CHEST  1 VIEW  COMPARISON:  10/20/2013  FINDINGS: Lungs are adequately inflated without focal consolidation or effusion. Cardiomediastinal silhouette is within normal. There is mild calcified plaque over the aortic arch. Remainder the exam is unchanged.  IMPRESSION: No active disease.   Electronically Signed   By: Elberta Fortis M.D.   On: 02/26/2015 21:28   Ct Head Wo Contrast  02/26/2015   CLINICAL DATA:  Confusion beginning at 7 a.m. today. History atrial fibrillation, hypertension.  EXAM: CT HEAD WITHOUT CONTRAST  TECHNIQUE: Contiguous axial images were obtained from the base of the skull through the vertex without intravenous contrast.  COMPARISON:  CT of the head 2012  FINDINGS: Moderate to severe ventriculomegaly, similar to from prior examination with disproportionate sulcal effacement at  the cerebral convexities, unchanged. Confluent supratentorial white matter hypodensities are similar. No intraparenchymal hemorrhage, mass effect, midline shift nor acute large vascular territory infarcts.  No abnormal extra-axial fluid collections. Mild calcific atherosclerosis the carotid siphons.  No skull fracture. The included ocular globes and orbital contents are non-suspicious. Bilateral ocular lens implants. Mild paranasal sinus mucosal thickening with the RIGHT sphenoid sinus air-fluid level. The mastoid air cells are well aerated radiated. Mild RIGHT mastoid air cell coalescence suggest prior mastoiditis without acute component.  IMPRESSION: No acute intracranial process.  Findings of normal pressure  hydrocephalus, progressed from prior imaging. Severe white matter changes likely represent chronic small vessel ischemic disease.  Mild acute paranasal sinusitis.   Electronically Signed   By: Awilda Metroourtnay  Bloomer   On: 02/26/2015 21:58    EKG:   Orders placed or performed during the hospital encounter of 02/26/15  . ED EKG  . ED EKG  A. fib with rapid ventricular rate of 142 bpm  IMPRESSION AND PLAN:   1. Acute encephalopathy secondary to sepsis/UTI. 2. UTI 3. Chronic A. fib with rapid rate. 4. History of hypertension, stable on home medications. 5. History of dementia. Plan: Admit to CCU/stepdown unit. Telemetry monitoring, IV antibiotics-ceftriaxone, follow up cultures, CBC. Start Cardizem drip, cycle cardiac enzymes. Continue home medications for hypertension.    All the records are reviewed and case discussed with ED provider. Management plans discussed with the patient, family and they are in agreement.  CODE STATUS: DO NOT RESUSCITATE  TOTAL TIME TAKING CARE OF THIS PATIENT: 50 minutes.    Crissie FiguresEDDY, Fareed Fung N M.D on 02/26/2015 at 11:50 PM  Between 7am to 6pm - Pager - [phone]  After 6pm go to [email or web address] - password [email or web address]  [facility]  Office  [phone]  CC: Primary care physician; Rafael BihariWALKER III, [name], MD

## 2015-02-26 NOTE — ED Notes (Signed)
MD at bedside. 

## 2015-02-26 NOTE — ED Provider Notes (Signed)
[facility] Emergency Department Provider Note     Time seen: 4380 3 PM  I have reviewed the triage vital signs and the nursing notes.  Level V caveat, patient unable to give review of systems or history HISTORY  Chief Complaint No chief complaint on file.    HPI Shannon StallMargie K Luckman is a 81 y.o. female brought to the ER by EMS for altered mental status that has begun today. According to family she has not been in her baseline mental status.She is known to be in rapid A. fib on arrival. No further information is given at the time for presentation.    No past medical history on file.  There are no active problems to display for this patient.   No past surgical history on file.  No current outpatient prescriptions on file.  Allergies Review of patient's allergies indicates not on file.  No family history on file.  Social History History  Substance Use Topics  . Smoking status: Not on file  . Smokeless tobacco: Not on file  . Alcohol Use: Not on file    Review of Systems Review of systems is unknown other than a reported altered mental status according to family.    ____________________________________________   PHYSICAL EXAM:  VITAL SIGNS: ED Triage Vitals  Enc Vitals Group     BP --      Pulse --      Resp --      Temp --      Temp src --      SpO2 --      Weight --      Height --      Head Cir --      Peak Flow --      Pain Score --      Pain Loc --      Pain Edu? --      Excl. in GC? --     Constitutional: Alert but disoriented. No acute distress is noted Eyes: Conjunctivae are normal. PERRL. Normal extraocular movements. ENT   Head: Normocephalic and atraumatic.   Nose: No congestion/rhinnorhea.   Mouth/Throat: Mucous membranes are moist.   Neck: No stridor. Hematological/Lymphatic/Immunilogical: No cervical lymphadenopathy. Cardiovascular: Rapid rate and irregular rhythm. Normal pulses Respiratory: Normal  respiratory effort without tachypnea nor retractions. Breath sounds are clear and equal bilaterally. No wheezes/rales/rhonchi. Gastrointestinal: Soft and nontender. No distention. No abdominal bruits. There is no CVA tenderness. Musculoskeletal: Nontender with normal range of motion in all extremities. No joint effusions.  No lower extremity tenderness nor edema. Neurologic:  Patient appears to have expressive aphasia or is nonverbal. Patient is moving all 4 extremities. Skin:  Skin is warm, dry and intact. No rash noted.   ____________________________________________ EKG: Atrial fibrillation with rapid ventricular response rate is 142 QRS with normal QT intervals normal no axis questionable inferior infarct age and determinate   LABS (pertinent positives/negatives)  Labs Reviewed  CBC WITH DIFFERENTIAL/PLATELET - Abnormal; Notable for the following:    WBC 21.4 (*)    Neutro Abs 18.2 (*)    Monocytes Absolute 1.4 (*)    All other components within normal limits  COMPREHENSIVE METABOLIC PANEL - Abnormal; Notable for the following:    Chloride 98 (*)    Glucose, Bld 127 (*)    ALT 13 (*)    All other components within normal limits  URINE CULTURE  LACTIC ACID, PLASMA  TROPONIN I  LACTIC ACID, PLASMA  URINALYSIS COMPLETEWITH MICROSCOPIC (  [facility])     ____________________________________________  ED COURSE:  Pertinent labs & imaging results that were available during my care of the patient were reviewed by me and considered in my medical decision making (see chart for details).   ____________________________________________   RADIOLOGY  CT head FINDINGS: Moderate to severe ventriculomegaly, similar to from prior examination with disproportionate sulcal effacement at the cerebral convexities, unchanged. Confluent supratentorial white matter hypodensities are similar. No intraparenchymal hemorrhage, mass effect, midline shift nor acute large vascular territory  infarcts.  No abnormal extra-axial fluid collections. Mild calcific atherosclerosis the carotid siphons.  No skull fracture. The included ocular globes and orbital contents are non-suspicious. Bilateral ocular lens implants. Mild paranasal sinus mucosal thickening with the RIGHT sphenoid sinus air-fluid level. The mastoid air cells are well aerated radiated. Mild RIGHT mastoid air cell coalescence suggest prior mastoiditis without acute component.  IMPRESSION: No acute intracranial process.  Findings of normal pressure hydrocephalus, progressed from prior imaging. Severe white matter changes likely represent chronic small vessel ischemic disease.  Mild acute paranasal sinusitis. ____________________________________________    FINAL ASSESSMENT AND PLAN  Altered mental status, rapid atrial fibrillation, UTI  Plan: Patient will need to be admitted because she cannot take her medications and she has significant UTI, as well as rapid A. fib. She is crying several doses of IV Cardizem could to control her heart rate.    Emily FilbertWilliams, Jonathan E, MD   Emily FilbertJonathan E Williams, MD 02/26/15 [phone]

## 2015-02-26 NOTE — ED Notes (Signed)
Spoke with Dr Betti Cruzeddy regarding patient's continued elevated heart rated.  Also talked with MD regarding fact that patient had not had any of her medications today.  Order given for now Cardizem.

## 2015-02-27 ENCOUNTER — Encounter: Payer: Self-pay | Admitting: *Deleted

## 2015-02-27 LAB — GLUCOSE, CAPILLARY
Glucose-Capillary: 115 mg/dL — ABNORMAL HIGH (ref 65–99)
Glucose-Capillary: 113 mg/dL — ABNORMAL HIGH (ref 65–99)

## 2015-02-27 LAB — CREATININE, SERUM: Creatinine, Ser: 0.63 mg/dL (ref 0.44–1.00)

## 2015-02-27 LAB — TSH: TSH: 1.28 u[IU]/mL (ref 0.350–4.500)

## 2015-02-27 LAB — TROPONIN I

## 2015-02-27 MED ORDER — ONDANSETRON HCL 4 MG/2ML IJ SOLN: 4.0000 mg | Freq: Four times a day (QID) | INTRAMUSCULAR | Status: DC | PRN

## 2015-02-27 MED ORDER — DILTIAZEM HCL 100 MG IV SOLR
5.0000 mg/h | INTRAVENOUS | Status: DC
  Administered 2015-02-27: 10 mg/h via INTRAVENOUS
  Administered 2015-02-27: 15 mg/h via INTRAVENOUS
  Administered 2015-02-27: 5 mg/h via INTRAVENOUS
  Administered 2015-02-28: 10 mg/h via INTRAVENOUS
  Filled 2015-02-27 (×4): qty 100

## 2015-02-27 MED ORDER — ENOXAPARIN SODIUM 40 MG/0.4ML ~~LOC~~ SOLN
40.0000 mg | SUBCUTANEOUS | Status: DC
  Administered 2015-02-27 – 2015-03-01 (×3): 40 mg via SUBCUTANEOUS
  Filled 2015-02-27 (×3): qty 0.4

## 2015-02-27 MED ORDER — DILTIAZEM HCL ER BEADS 240 MG PO CP24
240.0000 mg | ORAL_CAPSULE | Freq: Every day | ORAL | Status: DC
  Filled 2015-02-27: qty 1

## 2015-02-27 MED ORDER — ACETAMINOPHEN 325 MG PO TABS
650.0000 mg | ORAL_TABLET | Freq: Four times a day (QID) | ORAL | Status: DC | PRN
Start: 2015-02-27 — End: 2015-03-01

## 2015-02-27 MED ORDER — ONDANSETRON HCL 4 MG PO TABS: 4.0000 mg | ORAL_TABLET | Freq: Four times a day (QID) | ORAL | Status: DC | PRN

## 2015-02-27 MED ORDER — DOCUSATE SODIUM 100 MG PO CAPS
100.0000 mg | ORAL_CAPSULE | Freq: Two times a day (BID) | ORAL | Status: DC
  Administered 2015-02-28 – 2015-03-01 (×3): 100 mg via ORAL
  Filled 2015-02-27 (×3): qty 1

## 2015-02-27 MED ORDER — LOSARTAN POTASSIUM 50 MG PO TABS: 100.0000 mg | ORAL_TABLET | Freq: Every day | ORAL | Status: DC

## 2015-02-27 MED ORDER — ACETAMINOPHEN 500 MG PO TABS: 500.0000 mg | ORAL_TABLET | Freq: Four times a day (QID) | ORAL | Status: DC | PRN

## 2015-02-27 MED ORDER — CALCIUM CARBONATE-VITAMIN D 500-200 MG-UNIT PO TABS: 1.0000 | ORAL_TABLET | Freq: Every day | ORAL | Status: AC

## 2015-02-27 MED ORDER — ASPIRIN 325 MG PO TABS: 325.0000 mg | ORAL_TABLET | Freq: Every day | ORAL | Status: DC

## 2015-02-27 MED ORDER — FUROSEMIDE 20 MG PO TABS
20.0000 mg | ORAL_TABLET | Freq: Every day | ORAL | Status: DC
  Administered 2015-03-01: 20 mg via ORAL
  Filled 2015-02-27 (×2): qty 1

## 2015-02-27 MED ORDER — CEFTRIAXONE SODIUM IN DEXTROSE 20 MG/ML IV SOLN
1.0000 g | INTRAVENOUS | Status: DC
  Administered 2015-02-27 – 2015-02-28 (×2): 1 g via INTRAVENOUS
  Filled 2015-02-27 (×4): qty 50

## 2015-02-27 MED ORDER — DOCUSATE SODIUM 50 MG PO CAPS
50.0000 mg | ORAL_CAPSULE | Freq: Two times a day (BID) | ORAL | Status: DC
  Filled 2015-02-27 (×2): qty 1

## 2015-02-27 MED ORDER — CITALOPRAM HYDROBROMIDE 20 MG PO TABS
10.0000 mg | ORAL_TABLET | Freq: Every day | ORAL | Status: DC
  Administered 2015-02-28 – 2015-03-01 (×2): 10 mg via ORAL
  Filled 2015-02-27 (×2): qty 1

## 2015-02-27 MED ORDER — ACETAMINOPHEN 650 MG RE SUPP: 650.0000 mg | Freq: Four times a day (QID) | RECTAL | Status: DC | PRN

## 2015-02-27 MED ORDER — CETYLPYRIDINIUM CHLORIDE 0.05 % MT LIQD
7.0000 mL | Freq: Two times a day (BID) | OROMUCOSAL | Status: DC
  Administered 2015-02-27 – 2015-02-28 (×5): 7 mL via OROMUCOSAL

## 2015-02-27 MED ORDER — ASPIRIN EC 81 MG PO TBEC
81.0000 mg | DELAYED_RELEASE_TABLET | Freq: Every day | ORAL | Status: DC
  Administered 2015-02-28 – 2015-03-01 (×2): 81 mg via ORAL
  Filled 2015-02-27 (×2): qty 1

## 2015-02-27 NOTE — Progress Notes (Signed)
Initial Nutrition Assessment  DOCUMENTATION CODES:     INTERVENTION:    NUTRITION DIAGNOSIS:  Inadequate oral intake related to lethargy/confusion as evidenced by NPO status.    GOAL:     Goal would be for diet to be progressed within 5-7 days  MONITOR:  Energy intake:    REASON FOR ASSESSMENT:  Malnutrition Screening Tool    ASSESSMENT:  Pt admitted with AMS, UTI sepsis, possible aspiration.  SLP consulted but unable to evaluate this am secondary to lethargy  Past Medical History  Diagnosis Date  . Atrial fibrillation   . Peripheral edema   . Hypertension   . Dementia   . Pneumonia    Pt daughter at bedside and reports intake has been great prior to admission, eating normally.    Labs Electrolyte and Renal Profile:    Recent Labs Lab 02/26/15 2031 02/27/15 0352  BUN 14  --   CREATININE 0.67 0.63  NA 136  --   K 3.5  --    Glucose Profile:  Recent Labs  02/27/15 0157 02/27/15 0838  GLUCAP 115* 113*   Medications: colace, lasix  Height:  Ht Readings from Last 1 Encounters:  02/27/15 5\' 3"  (1.6 m)    Weight: No weight loss per daughter prior to admission  Wt Readings from Last 1 Encounters:  02/27/15 141 lb 1.5 oz (64 kg)         Wt Readings from Last 10 Encounters:  02/27/15 141 lb 1.5 oz (64 kg)     BMI:  Body mass index is 25 kg/(m^2).  Estimated Nutritional Needs:  Kcal:  [id number] kcals/d BEE 1024 kcals (IF 1.0-1.2, AF 1.2)  Protein:  64-77 gm/d (1.0-1.2 g/kg)  Fluid:  1600-194020ml/d (25-1030ml/kg)   Skin:  Reviewed, no issues  Diet Order:  NPO  EDUCATION NEEDS:  No education needs identified at this time  I/O last 3 completed shifts: In: 63.9 [I.V.:63.9] Out: -      Moderate level of care  Shannon Priestly B. Freida BusmanAllen, RD, LDN [phone] (pager)

## 2015-02-27 NOTE — ED Notes (Signed)
Patient resting quietly at this time.  Family at bedside.  Awaiting admission bed.

## 2015-02-27 NOTE — Consult Note (Signed)
Reason for Consult: confusion Referring Physician: Dr. Lysle Morales is an 81 y.o. female.  HPI: seen at request of Dr. Benjie Karvonen for confusion;  81 yo RHD F who presents to [facility] from home where she lives with her daughter due to increased confusion and lethargy.  Pt is normally completely dependent for most ADLs due to dementia and is mostly bedbound.  She became less responsive over the last few days.  Since being admitted last night, pt has improved per daughter and now is more alert and will follow.    Past Medical History  Diagnosis Date  . Atrial fibrillation   . Peripheral edema   . Hypertension   . Dementia   . Pneumonia     Past Surgical History  Procedure Laterality Date  . Cholecystectomy    . Abdominal hysterectomy    . Eye surgery    . Breast surgery    . Appendectomy    . Fracture surgery      Family History  Problem Relation Age of Onset  . Heart Problems Mother   . Cancer Father   . Cancer - Lung Sister     Social History:  reports that she has quit smoking. She does not have any smokeless tobacco history on file. She reports that she does not drink alcohol or use illicit drugs.  Allergies:  Allergies  Allergen Reactions  . Sulfa Antibiotics Itching  . Codeine Anxiety    Medications: personally reviewed by me as in chart  Results for orders placed or performed during the hospital encounter of 02/26/15 (from the past 48 hour(s))  CBC with Differential     Status: Abnormal   Collection Time: 02/26/15  8:31 PM  Result Value Ref Range   WBC 21.4 (H) 3.6 - 11.0 K/uL   RBC 4.07 3.80 - 5.20 MIL/uL   Hemoglobin 12.4 12.0 - 16.0 g/dL   HCT 37.4 35.0 - 47.0 %   MCV 91.8 80.0 - 100.0 fL   MCH 30.6 26.0 - 34.0 pg   MCHC 33.3 32.0 - 36.0 g/dL   RDW 13.8 11.5 - 14.5 %   Platelets 250 150 - 440 K/uL   Neutrophils Relative % 85 %   Neutro Abs 18.2 (H) 1.4 - 6.5 K/uL   Lymphocytes Relative 8 %   Lymphs Abs 1.8 1.0 - 3.6 K/uL   Monocytes Relative 6 %   Monocytes Absolute 1.4 (H) 0.2 - 0.9 K/uL   Eosinophils Relative 0 %   Eosinophils Absolute 0.0 0 - 0.7 K/uL   Basophils Relative 1 %   Basophils Absolute 0.1 0 - 0.1 K/uL  Comprehensive metabolic panel     Status: Abnormal   Collection Time: 02/26/15  8:31 PM  Result Value Ref Range   Sodium 136 135 - 145 mmol/L   Potassium 3.5 3.5 - 5.1 mmol/L   Chloride 98 (L) 101 - 111 mmol/L   CO2 28 22 - 32 mmol/L   Glucose, Bld 127 (H) 65 - 99 mg/dL   BUN 14 6 - 20 mg/dL   Creatinine, Ser 0.67 0.44 - 1.00 mg/dL   Calcium 9.3 8.9 - 10.3 mg/dL   Total Protein 7.2 6.5 - 8.1 g/dL   Albumin 3.6 3.5 - 5.0 g/dL   AST 21 15 - 41 U/L   ALT 13 (L) 14 - 54 U/L   Alkaline Phosphatase 48 38 - 126 U/L   Total Bilirubin 0.6 0.3 - 1.2 mg/dL   GFR calc non  Af Amer >60 >60 mL/min   GFR calc Af Amer >60 >60 mL/min    Comment: (NOTE) The eGFR has been calculated using the CKD EPI equation. This calculation has not been validated in all clinical situations. eGFR's persistently <60 mL/min signify possible Chronic Kidney Disease.    Anion gap 10 5 - 15  Troponin I     Status: None   Collection Time: 02/26/15  8:31 PM  Result Value Ref Range   Troponin I <0.03 <0.031 ng/mL    Comment:        NO INDICATION OF MYOCARDIAL INJURY.   Lactic acid, plasma     Status: None   Collection Time: 02/26/15  8:32 PM  Result Value Ref Range   Lactic Acid, Venous 1.6 0.5 - 2.0 mmol/L  Urinalysis complete, with microscopic     Status: Abnormal   Collection Time: 02/26/15  9:28 PM  Result Value Ref Range   Color, Urine AMBER (A) YELLOW   APPearance CLOUDY (A) CLEAR   Glucose, UA NEGATIVE NEGATIVE mg/dL   Bilirubin Urine NEGATIVE NEGATIVE   Ketones, ur NEGATIVE NEGATIVE mg/dL   Specific Gravity, Urine 1.015 1.005 - 1.030   Hgb urine dipstick 2+ (A) NEGATIVE   pH 6.0 5.0 - 8.0   Protein, ur 30 (A) NEGATIVE mg/dL   Nitrite NEGATIVE NEGATIVE   Leukocytes, UA NEGATIVE NEGATIVE   RBC / HPF TOO NUMEROUS TO COUNT 0 - 5  RBC/hpf   WBC, UA 6-30 0 - 5 WBC/hpf   Bacteria, UA MANY (A) NONE SEEN   Squamous Epithelial / LPF 0-5 (A) NONE SEEN   Mucous PRESENT   Urine culture     Status: None (Preliminary result)   Collection Time: 02/26/15  9:29 PM  Result Value Ref Range   Specimen Description IN/OUT CATH URINE    Special Requests NONE    Culture      >=100,000 COLONIES/mL GRAM NEGATIVE RODS IDENTIFICATION AND SUSCEPTIBILITIES TO FOLLOW    Report Status PENDING   Glucose, capillary     Status: Abnormal   Collection Time: 02/27/15  1:57 AM  Result Value Ref Range   Glucose-Capillary 115 (H) 65 - 99 mg/dL  Creatinine, serum     Status: None   Collection Time: 02/27/15  3:52 AM  Result Value Ref Range   Creatinine, Ser 0.63 0.44 - 1.00 mg/dL   GFR calc non Af Amer >60 >60 mL/min   GFR calc Af Amer >60 >60 mL/min    Comment: (NOTE) The eGFR has been calculated using the CKD EPI equation. This calculation has not been validated in all clinical situations. eGFR's persistently <60 mL/min signify possible Chronic Kidney Disease.   TSH     Status: None   Collection Time: 02/27/15  3:52 AM  Result Value Ref Range   TSH 1.280 0.350 - 4.500 uIU/mL  Troponin I     Status: None   Collection Time: 02/27/15  3:52 AM  Result Value Ref Range   Troponin I <0.03 <0.031 ng/mL    Comment:        NO INDICATION OF MYOCARDIAL INJURY.   Glucose, capillary     Status: Abnormal   Collection Time: 02/27/15  8:38 AM  Result Value Ref Range   Glucose-Capillary 113 (H) 65 - 99 mg/dL    Dg Chest 1 View  02/26/2015   CLINICAL DATA:  Altered mental status.  EXAM: CHEST  1 VIEW  COMPARISON:  10/20/2013  FINDINGS: Lungs are adequately inflated  without focal consolidation or effusion. Cardiomediastinal silhouette is within normal. There is mild calcified plaque over the aortic arch. Remainder the exam is unchanged.  IMPRESSION: No active disease.   Electronically Signed   By: Marin Olp M.D.   On: 02/26/2015 21:28   Ct  Head Wo Contrast  02/26/2015   CLINICAL DATA:  Confusion beginning at 7 a.m. today. History atrial fibrillation, hypertension.  EXAM: CT HEAD WITHOUT CONTRAST  TECHNIQUE: Contiguous axial images were obtained from the base of the skull through the vertex without intravenous contrast.  COMPARISON:  CT of the head 2012  FINDINGS: Moderate to severe ventriculomegaly, similar to from prior examination with disproportionate sulcal effacement at the cerebral convexities, unchanged. Confluent supratentorial white matter hypodensities are similar. No intraparenchymal hemorrhage, mass effect, midline shift nor acute large vascular territory infarcts.  No abnormal extra-axial fluid collections. Mild calcific atherosclerosis the carotid siphons.  No skull fracture. The included ocular globes and orbital contents are non-suspicious. Bilateral ocular lens implants. Mild paranasal sinus mucosal thickening with the RIGHT sphenoid sinus air-fluid level. The mastoid air cells are well aerated radiated. Mild RIGHT mastoid air cell coalescence suggest prior mastoiditis without acute component.  IMPRESSION: No acute intracranial process.  Findings of normal pressure hydrocephalus, progressed from prior imaging. Severe white matter changes likely represent chronic small vessel ischemic disease.  Mild acute paranasal sinusitis.   Electronically Signed   By: Elon Alas   On: 02/26/2015 21:58    Review of Systems  Unable to perform ROS: dementia   Blood pressure 138/65, pulse 105, temperature 99.7 F (37.6 C), temperature source Axillary, resp. rate 21, height 5' 3"  (1.6 m), weight 64 kg (141 lb 1.5 oz), SpO2 94 %. Physical Exam  Constitutional: She appears well-developed and well-nourished. She appears distressed.  HENT:  Head: Normocephalic and atraumatic.  Mouth/Throat: Oropharynx is clear and moist.  Eyes: Conjunctivae and EOM are normal. Pupils are equal, round, and reactive to light.  Neck: Normal range of  motion. Neck supple.  Cardiovascular: Normal rate, regular rhythm and normal heart sounds.   Respiratory: Effort normal and breath sounds normal.  GI: Soft. Bowel sounds are normal.  Neurological: She is alert. She has normal strength and normal reflexes. No cranial nerve deficit or sensory deficit. She exhibits abnormal muscle tone.  Only oriented to person but will follow simple commands, minimal speech with dysarthria  Skin: Skin is dry. She is not diaphoretic.   Labs personally reviewed by me  CT of head personally reviewed by me and shows severe atrophy and white matter changes  Assessment/Plan: 1.  Encephalopathy-  Improving, this is infection related and pt should return to baseline 2.  Severe atrophy and white matter changes-  This is not NPH but more atrophy which is likely due to small vessel disease being uncontrolled 3.  Dementia-  Likely multi-infarct but could have neurodegerative component as well -  Continue to treat infections -  Agree with ASA 73m daily -  No obvious need for LP now -  Will sign off, please call questions or if pts mental status does not improve -  Should f/u with her Neurologist as outpatient in 3 months  [place]5/16/2016, 6:24 PM

## 2015-02-27 NOTE — Care Management (Signed)
Order present  For CM assessment for discharge planning.  Patient presents with confusion now felt to be encephalopathy due to UTI.  She was admitted to ICU due to rapid a fib requiring a cardizem drip.  Patient is currently followed by [facility] nursing, physical therapy and aide.  Agency is aware of admission

## 2015-02-27 NOTE — Progress Notes (Signed)
ANTIBIOTIC CONSULT NOTE - INITIAL  Pharmacy Consult for ceftriaxone Indication: UTI  Allergies  Allergen Reactions  . Sulfa Antibiotics Itching  . Codeine Anxiety    Patient Measurements: Height: 5\' 3"  (160 cm) Weight: 141 lb 1.5 oz (64 kg) IBW/kg (Calculated) : 52.4   Vital Signs: Temp: 100.1 F (37.8 C) (05/16 0210) Temp Source: Axillary (05/16 0210) BP: 139/84 mmHg (05/16 0210) Pulse Rate: 127 (05/16 0210) Intake/Output from previous day: 05/15 0701 - 05/16 0700 In: 3.9 [I.V.:3.9] Out: -  Intake/Output from this shift: Total I/O In: 3.9 [I.V.:3.9] Out: -   Labs:  Recent Labs  02/26/15 2031  WBC 21.4*  HGB 12.4  PLT 250  CREATININE 0.67   Estimated Creatinine Clearance: 41.2 mL/min (by C-G formula based on Cr of 0.67). No results for input(s): VANCOTROUGH, VANCOPEAK, VANCORANDOM, GENTTROUGH, GENTPEAK, GENTRANDOM, TOBRATROUGH, TOBRAPEAK, TOBRARND, AMIKACINPEAK, AMIKACINTROU, AMIKACIN in the last 72 hours.   Microbiology: No results found for this or any previous visit (from the past 720 hour(s)).  Medical History: Past Medical History  Diagnosis Date  . Atrial fibrillation   . Peripheral edema   . Hypertension   . Dementia   . Pneumonia     Medications:  Scheduled:  . aspirin EC  81 mg Oral Daily  . calcium-vitamin D  1 tablet Oral Daily  . cefTRIAXone (ROCEPHIN)  IV  1 g Intravenous Q24H  . citalopram  10 mg Oral Daily  . diltiazem  240 mg Oral Once  . diltiazem  240 mg Oral Daily  . docusate sodium  100 mg Oral BID  . enoxaparin (LOVENOX) injection  40 mg Subcutaneous Q24H  . furosemide  20 mg Oral Daily  . losartan  100 mg Oral Daily   Infusions:  . diltiazem (CARDIZEM) infusion 15 mg/hr (02/27/15 0200)   Assessment: Empiric ceftriaxone for UTI.  Plan:  Ceftriaxone 1 g iv q 24 h.  Follow up culture results  Luisa Harthristy, Massai Hankerson D 02/27/2015,2:15 AM

## 2015-02-27 NOTE — Progress Notes (Signed)
Speech Therapy Note:  Received order, reviewed chart and consulted nsg regarding pt's status today. Pt reported pt was too lethargic and would not be appropriate for BSE today; nsg rec'd returning for BSE tomorrow. Family present and agreed stating pt "choked on a pill" yesterday "making us come to the hospital". ST services will f/u tomorrow. Dietician aware; active oral diet ordered addressed. NSG agreed.

## 2015-02-27 NOTE — Progress Notes (Signed)
[facility]  Patient Status: Active (receiving services up to time of hospitalization)  [facility] is providing the following services: RN, PT and HHA  If patient discharges after hours, please call [phone].   Lanae CrumblyKristen Hayworth 02/27/2015, 12:21 PM

## 2015-02-27 NOTE — Progress Notes (Signed)
[facility] [place] [facility]   PATIENT NAME: Shannon RileyMargie Williams    MR#:  [id number]  DATE OF BIRTH:  Aug 07, 1922  SUBJECTIVE:  Unable to obtain an history of present illness from this patient as she is lethargic and not very responsive. REVIEW OF SYSTEMS:    Review of Systems  Unable to perform ROS: dementia    Tolerating Diet: There is a concern for aspiration so she is not eating     DRUG ALLERGIES:   Allergies  Allergen Reactions  . Sulfa Antibiotics Itching  . Codeine Anxiety    VITALS:  Blood pressure 138/65, pulse 105, temperature 100 F (37.8 C), temperature source Axillary, resp. rate 21, height 5\' 3"  (1.6 m), weight 64 kg (141 lb 1.5 oz), SpO2 94 %.  PHYSICAL EXAMINATION:   Physical Exam  Constitutional: No distress.  HENT:  Head: Normocephalic.  Eyes:  Sluggish pupillary response  Neck: Neck supple. No tracheal deviation present.  Cardiovascular:  Murmur heard. Tachycardia with irregular irregular heartbeat  Pulmonary/Chest: Effort normal and breath sounds normal. No respiratory distress. She has no wheezes.  Abdominal: Soft. Bowel sounds are normal. She exhibits no distension. There is no tenderness. There is no rebound.  Musculoskeletal: Normal range of motion. She exhibits no edema or tenderness.  Neurological:  Lethargic  Skin: Skin is warm.  Psychiatric:  Lethargic with dementia      LABORATORY PANEL:   CBC  Recent Labs Lab 02/26/15 2031  WBC 21.4*  HGB 12.4  HCT 37.4  PLT 250   ------------------------------------------------------------------------------------------------------------------  Chemistries   Recent Labs Lab 02/26/15 2031 02/27/15 0352  NA 136  --   K 3.5  --   CL 98*  --   CO2 28  --   GLUCOSE 127*  --   BUN 14  --   CREATININE 0.67 0.63  CALCIUM 9.3  --   AST 21  --   ALT 13*  --   ALKPHOS 48  --   BILITOT 0.6  --     ------------------------------------------------------------------------------------------------------------------  Cardiac Enzymes  Recent Labs Lab 02/26/15 2031 02/27/15 0352  TROPONINI <0.03 <0.03   ------------------------------------------------------------------------------------------------------------------  RADIOLOGY:  Dg Chest 1 View  02/26/2015   CLINICAL DATA:  Altered mental status.  EXAM: CHEST  1 VIEW  COMPARISON:  10/20/2013  FINDINGS: Lungs are adequately inflated without focal consolidation or effusion. Cardiomediastinal silhouette is within normal. There is mild calcified plaque over the aortic arch. Remainder the exam is unchanged.  IMPRESSION: No active disease.   Electronically Signed   By: Elberta Fortisaniel  Boyle M.D.   On: 02/26/2015 21:28   Ct Head Wo Contrast IMPRESSION: No acute intracranial process.  Findings of normal pressure hydrocephalus, progressed from prior imaging. Severe white matter changes likely represent chronic small vessel ischemic disease.  Mild acute paranasal sinusitis.   Electronically Signed   By: Awilda Metroourtnay  Bloomer   On: 02/26/2015 21:58     ASSESSMENT AND PLAN:   This is a 81 year old female with a history of dementia who presents with acute encephalopathy.  1. Acute encephalopathy: This is likely secondary to urinary tract infection. Patient is currently on ceftriaxone which I will continue. I will follow up on blood and urine cultures. Her CAT scan does show normal pressure hydrocephalus. Therefore I will go ahead and consult neurology for further evaluation and management. It is noted that the patient is 81 years old. I do not see any indications that she may have suffered a stroke.  2. Atrial fibrillation with RVR: Patient continues to be on diltiazem drip. I will continue this diltiazem drip and titrate heart rates less than 100. I will transition her to oral diltiazem once speech has seen her and evaluated her for a diet. I will continue  aspirin.  3. Dementia: Patient's clearly not at her baseline. I will continue to monitor.   4. Essential hypertension: Patient is on losartan and diltiazem which I will continue and monitor her blood pressure closely.     All the records are reviewed and case discussed with Nurse. Management plans discussed with the patient's daughter and she  is in agreement.  CODE STATUDO NOT RESUSCITATE TOTAL TIME TAKING CARE OF THIS PATIENT: 35 minutes.   POSSIBLE D/C IN 2-3  DAYS, DEPENDING ON CLINICAL CONDITION.   Dmari Schubring M.D on 02/27/2015 at 11:30 AM  Between 7am to 6pm - Pager - [phone] After 6pm go to [email or web address] - password [email or web address] [facility]  [facility]  Office  [phone]  CC: Primary care physician; Shannon Williams

## 2015-02-28 LAB — BASIC METABOLIC PANEL
ANION GAP: 8 (ref 5–15)
BUN: 13 mg/dL (ref 6–20)
CHLORIDE: 100 mmol/L — AB (ref 101–111)
CO2: 27 mmol/L (ref 22–32)
Calcium: 8.9 mg/dL (ref 8.9–10.3)
Creatinine, Ser: 0.62 mg/dL (ref 0.44–1.00)
GFR calc non Af Amer: >60 mL/min (ref >60)
Glucose, Bld: 86 mg/dL (ref 65–99)
POTASSIUM: 3.2 mmol/L — AB (ref 3.5–5.1)
Sodium: 135 mmol/L (ref 135–145)

## 2015-02-28 LAB — MAGNESIUM: MAGNESIUM: 1.8 mg/dL (ref 1.7–2.4)

## 2015-02-28 LAB — URINE CULTURE

## 2015-02-28 LAB — GLUCOSE, CAPILLARY: GLUCOSE-CAPILLARY: 90 mg/dL (ref 65–99)

## 2015-02-28 LAB — PHOSPHORUS: PHOSPHORUS: 2.6 mg/dL (ref 2.5–4.6)

## 2015-02-28 MED ORDER — POTASSIUM CHLORIDE 10 MEQ/100ML IV SOLN
10.0000 meq | INTRAVENOUS | Status: DC
  Filled 2015-02-28 (×4): qty 100

## 2015-02-28 MED ORDER — POTASSIUM CHLORIDE 2 MEQ/ML IV SOLN: Freq: Once | INTRAVENOUS | Status: DC

## 2015-02-28 MED ORDER — POTASSIUM CHLORIDE 20 MEQ/15ML (10%) PO SOLN
40.0000 meq | Freq: Once | ORAL | Status: AC
  Administered 2015-02-28: 40 meq via ORAL
  Filled 2015-02-28: qty 30

## 2015-02-28 MED ORDER — SODIUM CHLORIDE 0.9 % IV SOLN
INTRAVENOUS | Status: DC
  Administered 2015-02-28: 15:00:00 via INTRAVENOUS

## 2015-02-28 MED ORDER — DILTIAZEM HCL ER COATED BEADS 240 MG PO CP24
240.0000 mg | ORAL_CAPSULE | Freq: Every day | ORAL | Status: DC
  Administered 2015-02-28 – 2015-03-01 (×2): 240 mg via ORAL
  Filled 2015-02-28: qty 1

## 2015-02-28 MED ORDER — DILTIAZEM HCL ER COATED BEADS 240 MG PO CP24: 240.0000 mg | ORAL_CAPSULE | Freq: Every day | ORAL | Status: DC

## 2015-02-28 MED ORDER — MAGNESIUM SULFATE 2 GM/50ML IV SOLN
2.0000 g | Freq: Once | INTRAVENOUS | Status: AC
  Administered 2015-02-28: 2 g via INTRAVENOUS
  Filled 2015-02-28: qty 50

## 2015-02-28 NOTE — Evaluation (Signed)
Clinical/Bedside Swallow Evaluation Patient Details  Name: Shannon Williams MRN: [id number] Date of Birth: 1922-09-05  Today's Date: 02/28/2015 Time: SLP Start Time (ACUTE ONLY): 0945 SLP Stop Time (ACUTE ONLY): 1045 SLP Time Calculation (min) (ACUTE ONLY): 60 min  Past Medical History:  Past Medical History  Diagnosis Date  . Atrial fibrillation   . Peripheral edema   . Hypertension   . Dementia   . Pneumonia    Past Surgical History:  Past Surgical History  Procedure Laterality Date  . Cholecystectomy    . Abdominal hysterectomy    . Eye surgery    . Breast surgery    . Appendectomy    . Fracture surgery     HPI:  pt is cared for at home by family. They assist her w/ ADLs. Family reported no trouble swallowing w/ foods/liquids previously - stated she ate well.    Assessment / Plan / Recommendation Clinical Impression  Pt appears at reduced risk for aspiration at this time; she appeared to safely tolerate trials of thin liquids and purees following general aspiration precautions. She reuquired feeding assistance w/ the po's and verbal cues. during the tasks. No significant oral phase deficits noted during this eval. No soldids were assessed during this eval today but will be assessed ongoing.     Aspiration Risk   (reduced)    Diet Recommendation Dysphagia 1 (Puree);Thin   Medication Administration: Crushed with puree Compensations: Slow rate;Small sips/bites    Other  Recommendations Oral Care Recommendations: Oral care BID   Follow Up Recommendations       Frequency and Duration min 3x week  1 week   Pertinent Vitals/Pain denied    SLP Swallow Goals     Swallow Study Prior Functional Status       General Date of Onset: 02/26/15 Other Pertinent Information: pt is cared for at home by family. They assist her w/ ADLs. Family reported no trouble swallowing w/ foods/liquids previously - stated she ate well.  Type of Study: Bedside swallow evaluation Previous  Swallow Assessment: none Diet Prior to this Study: Regular;Thin liquids (soft foods) Temperature Spikes Noted: No Respiratory Status: Room air History of Recent Intubation: No Behavior/Cognition: Alert;Cooperative;Confused;Requires cueing Oral Cavity - Dentition:  (denture partial; few lower teeth) Self-Feeding Abilities: Needs assist Patient Positioning: Upright in bed Baseline Vocal Quality: Low vocal intensity Volitional Cough: Weak;Cognitively unable to elicit Volitional Swallow: Able to elicit    Oral/Motor/Sensory Function Overall Oral Motor/Sensory Function: Appears within functional limits for tasks assessed   Ice Chips Ice chips: Within functional limits Presentation: Spoon (3)   Thin Liquid Thin Liquid: Within functional limits Presentation: Cup;Straw;Self Fed (4 trials each)    Nectar Thick Nectar Thick Liquid: Not tested   Honey Thick Honey Thick Liquid: Not tested   Puree Puree: Within functional limits Presentation: Spoon (~3 ozs total fed to her)   Solid   GO    Solid: Not tested       Watson,Katherine 02/28/2015,11:25 AM

## 2015-02-28 NOTE — Progress Notes (Signed)
[facility] [place] [facility]   PATIENT NAME: Shannon RileyMargie Williams    MR#:  [id number]  DATE OF BIRTH:  Jan 25, 1922  SUBJECTIVE:  Patient is much more alert today. Family is at bedside.  REVIEW OF SYSTEMS:    Review of Systems  Unable to perform ROS: dementia    Tolerating Diet:yes      DRUG ALLERGIES:   Allergies  Allergen Reactions  . Sulfa Antibiotics Itching  . Codeine Anxiety    VITALS:  Blood pressure 110/49, pulse 89, temperature 98 F (36.7 C), temperature source Oral, resp. rate 18, height 5\' 3"  (1.6 m), weight 64.7 kg (142 lb 10.2 oz), SpO2 97 %.  PHYSICAL EXAMINATION:   Physical Exam    LABORATORY PANEL:   CBC  Recent Labs Lab 02/26/15 2031  WBC 21.4*  HGB 12.4  HCT 37.4  PLT 250   ------------------------------------------------------------------------------------------------------------------  Chemistries   Recent Labs Lab 02/26/15 2031  02/28/15 0438  NA 136  --  135  K 3.5  --  3.2*  CL 98*  --  100*  CO2 28  --  27  GLUCOSE 127*  --  86  BUN 14  --  13  CREATININE 0.67  < > 0.62  CALCIUM 9.3  --  8.9  MG  --   --  1.8  AST 21  --   --   ALT 13*  --   --   ALKPHOS 48  --   --   BILITOT 0.6  --   --   < > = values in this interval not displayed. ------------------------------------------------------------------------------------------------------------------  Cardiac Enzymes  Recent Labs Lab 02/26/15 2031 02/27/15 0352  TROPONINI <0.03 <0.03   ------------------------------------------------------------------------------------------------------------------  RADIOLOGY:  Dg Chest 1 View  02/26/2015   CLINICAL DATA:  Altered mental status.  EXAM: CHEST  1 VIEW  COMPARISON:  10/20/2013  FINDINGS: Lungs are adequately inflated without focal consolidation or effusion. Cardiomediastinal silhouette is within normal. There is mild calcified plaque over the aortic arch. Remainder the exam is unchanged.   IMPRESSION: No active disease.   Electronically Signed   By: Elberta Fortisaniel  Boyle M.D.   On: 02/26/2015 21:28   Ct Head Wo Contrast  02/26/2015   CLINICAL DATA:  Confusion beginning at 7 a.m. today. History atrial fibrillation, hypertension.  EXAM: CT HEAD WITHOUT CONTRAST  TECHNIQUE: Contiguous axial images were obtained from the base of the skull through the vertex without intravenous contrast.  COMPARISON:  CT of the head 2012  FINDINGS: Moderate to severe ventriculomegaly, similar to from prior examination with disproportionate sulcal effacement at the cerebral convexities, unchanged. Confluent supratentorial white matter hypodensities are similar. No intraparenchymal hemorrhage, mass effect, midline shift nor acute large vascular territory infarcts.  No abnormal extra-axial fluid collections. Mild calcific atherosclerosis the carotid siphons.  No skull fracture. The included ocular globes and orbital contents are non-suspicious. Bilateral ocular lens implants. Mild paranasal sinus mucosal thickening with the RIGHT sphenoid sinus air-fluid level. The mastoid air cells are well aerated radiated. Mild RIGHT mastoid air cell coalescence suggest prior mastoiditis without acute component.  IMPRESSION: No acute intracranial process.  Findings of normal pressure hydrocephalus, progressed from prior imaging. Severe white matter changes likely represent chronic small vessel ischemic disease.  Mild acute paranasal sinusitis.   Electronically Signed   By: Awilda Metroourtnay  Bloomer   On: 02/26/2015 21:58     ASSESSMENT AND PLAN:   This is a 81 year old female with a history of dementia who  presents with acute encephalopathy.  1. Acute encephalopathy: This is likely secondary to urinary tract infection. Patient is currently on ceftriaxone which I will continue. Her CAT scan does show normal pressure hydrocephalus. I have consulted neurology for their expertise. According to neurology he does not feel that this is NPH but more  atrophy which is likely due to small vessel disease being uncontrolled..   2. Atrial fibrillation with RVR: patient's heart rates are better controlled. Patient will be transitioned off the diltiazem and onto oral diltiazem. Patient then can be transferred to telemetry.  3. Dementia: Patient appears to be at her baseline. Her dementia is likely multi-infarct but could have neuro degenerative component as well per neurology consultation.  4. Essential hypertension: Patient is on losartan and diltiazem which I will continue and monitor her blood pressure closely.    Management plans discussed with the patient's daughter at bedside who is in agreement.  CODE STATUS: DO NOT RESUSCITATE  TOTAL TIME TAKING CARE OF THIS PATIENT: 25 minutes.   Patient should be able to be discharged home tomorrow with her family.   Izreal Kock M.D on 02/28/2015 at 2:05 PM  Between 7am to 6pm - Pager - [phone] After 6pm go to [email or web address] - password [email or web address] [facility]  [facility] [place] [facility]  Office  [phone]  CC: Primary care physician; Rafael BihariWALKER III, [name], MD

## 2015-02-28 NOTE — Care Management (Signed)
RNCM consult received. Potential discharge back to home tomorrow per daughter Natalia Leatherwoodkatherine. Per Natalia LeatherwoodKatherine patient will need new home health orders and face to face. [facility] I have notified Tawanna CoolerChristen with [facility] of discharge plan. RNCM to continue to follow.

## 2015-03-01 LAB — BASIC METABOLIC PANEL
Anion gap: 8 (ref 5–15)
BUN: 12 mg/dL (ref 6–20)
CALCIUM: 8.3 mg/dL — AB (ref 8.9–10.3)
CO2: 26 mmol/L (ref 22–32)
Chloride: 100 mmol/L — ABNORMAL LOW (ref 101–111)
Creatinine, Ser: 0.56 mg/dL (ref 0.44–1.00)
GFR calc Af Amer: >60 mL/min (ref >60)
GFR calc non Af Amer: >60 mL/min (ref >60)
Glucose, Bld: 93 mg/dL (ref 65–99)
POTASSIUM: 3.4 mmol/L — AB (ref 3.5–5.1)
SODIUM: 134 mmol/L — AB (ref 135–145)

## 2015-03-01 LAB — MAGNESIUM: MAGNESIUM: 2 mg/dL (ref 1.7–2.4)

## 2015-03-01 LAB — PHOSPHORUS: Phosphorus: 2.5 mg/dL (ref 2.5–4.6)

## 2015-03-01 MED ORDER — CIPROFLOXACIN HCL 250 MG PO TABS: 250.0000 mg | ORAL_TABLET | Freq: Two times a day (BID) | ORAL | Status: DC

## 2015-03-01 MED ORDER — POTASSIUM CHLORIDE CRYS ER 20 MEQ PO TBCR
40.0000 meq | EXTENDED_RELEASE_TABLET | Freq: Once | ORAL | Status: AC
  Administered 2015-03-01: 40 meq via ORAL
  Filled 2015-03-01: qty 2

## 2015-03-01 NOTE — Care Management (Signed)
Discharge home with resumption of home health nursing, PT and aide with [facility]

## 2015-03-01 NOTE — Discharge Instructions (Signed)
Routine follow up with PMD in 2 weeks. Diet Low sodium diet. Activities as tolerated.

## 2015-03-01 NOTE — Progress Notes (Signed)
2 orders for Cardizem 240 mg- MD Esaw GrandchildVachanni said to d/c one of them, will follow

## 2015-03-01 NOTE — Progress Notes (Signed)
VSS. Confused. Family at the bedside. IV and tele removed. Foley removed and pt voided. Prescriptions given to pt. Discharge instructions given to family. Pt to return home via EMS. Pt has no further concerns at this time.

## 2015-03-01 NOTE — Discharge Summary (Signed)
[facility] - [place] at [facility]   PATIENT NAME: Shannon Williams    MR#:  [id number]  DATE OF BIRTH:  23-Mar-1922  DATE OF ADMISSION:  02/26/2015 ADMITTING PHYSICIAN: Crissie Figures, MD  DATE OF DISCHARGE: 03/01/2015  PRIMARY CARE PHYSICIAN: Rafael Bihari, MD    ADMISSION DIAGNOSIS:  UTI (lower urinary tract infection) [N39.0] Atrial fibrillation with rapid ventricular response [I48.91] Altered mental status, unspecified altered mental status type [R41.82]  DISCHARGE DIAGNOSIS:  Principal Problem:   Acute encephalopathy Active Problems:   UTI (lower urinary tract infection)   Atrial fibrillation with RVR   HTN (hypertension)   Dementia   Encephalopathy acute   SECONDARY DIAGNOSIS:   Past Medical History  Diagnosis Date  . Atrial fibrillation   . Peripheral edema   . Hypertension   . Dementia   . Pneumonia     HOSPITAL COURSE:    This is a 81 year old female with a history of dementia who presents with acute encephalopathy.  1. Acute encephalopathy: This is likely secondary to urinary tract infection. Patient is currently on ceftriaxone. Her CAT scan does show normal pressure hydrocephalus. consulted neurology for their expertise. According to neurology he does not feel that this is NPH but more atrophy which is likely due to small vessel disease being uncontrolled.. Urine cx- showed E coli- sensitive to Ciprofloxacine.  2. Atrial fibrillation with RVR: patient's heart rates are better controlled. Initially needed IV cardizem- swiched to oral and stable.  3. Dementia: Patient appears to be at her baseline. Her dementia is likely multi-infarct but could have neuro degenerative component as well per neurology consultation.  4. Essential hypertension: Patient is on losartan and diltiazem which was continue and monitor her blood pressure closely.  DISCHARGE CONDITIONS:   Stable.  CONSULTS OBTAINED:  Treatment Team:  Mellody Drown, MD  DRUG ALLERGIES:   Allergies  Allergen Reactions  . Sulfa Antibiotics Itching  . Codeine Anxiety    DISCHARGE MEDICATIONS:   Current Discharge Medication List    START taking these medications   Details  ciprofloxacin (CIPRO) 250 MG tablet Take 1 tablet (250 mg total) by mouth 2 (two) times daily. Qty: 6 tablet, Refills: 0      CONTINUE these medications which have NOT CHANGED   Details  acetaminophen (TYLENOL) 500 MG tablet Take 500 mg by mouth every 6 (six) hours as needed.    aspirin 325 MG tablet Take 325 mg by mouth daily.    calcium-vitamin D (OSCAL WITH D) 500-200 MG-UNIT per tablet Take 1 tablet by mouth.    citalopram (CELEXA) 10 MG tablet Take 10 mg by mouth daily.    diltiazem (TIAZAC) 240 MG 24 hr capsule Take 240 mg by mouth daily.    docusate sodium (COLACE) 50 MG capsule Take 50 mg by mouth 2 (two) times daily.    furosemide (LASIX) 20 MG tablet Take 20 mg by mouth.    losartan (COZAAR) 100 MG tablet Take 100 mg by mouth daily.         DISCHARGE INSTRUCTIONS:    folow with PMD in 2 weeks. Low Salt diet Activity as tolerated.  If you experience worsening of your admission symptoms, develop shortness of breath, life threatening emergency, suicidal or homicidal thoughts you must seek medical attention immediately by calling 911 or calling your MD immediately  if symptoms less severe.  You Must read complete instructions/literature along with all the possible adverse reactions/side effects for all the Medicines  you take and that have been prescribed to you. Take any new Medicines after you have completely understood and accept all the possible adverse reactions/side effects.   Please note  You were cared for by a hospitalist during your hospital stay. If you have any questions about your discharge medications or the care you received while you were in the hospital after you are discharged, you can call the unit and asked to speak with the  hospitalist on call if the hospitalist that took care of you is not available. Once you are discharged, your primary care physician will handle any further medical issues. Please note that NO REFILLS for any discharge medications will be authorized once you are discharged, as it is imperative that you return to your primary care physician (or establish a relationship with a primary care physician if you do not have one) for your aftercare needs so that they can reassess your need for medications and monitor your lab values.  Today   CHIEF COMPLAINT:   Chief Complaint  Patient presents with  . Altered Mental Status    HISTORY OF PRESENT ILLNESS:  Shannon Williams  is a 81 y.o. female with a known history of chronic atrial fibrillation, hypertension, dementia presents to the emergency room via EMS with the complaints of altered mental status since this morning. Patient is chronically bedbound and lives with her daughter at home and today morning she was noted to have increased confusion, hence brought to the emergency room for further evaluation. Denies any fever, chest pain, shortness of breath, focal weakness or numbness, nausea, vomiting, diarrhea, abdominal pain, dysuria. In the emergency room patient was evaluated by the ED physician and was noted to be in atrial fibrillation with a rapid ventricular rate of 142 bpm. Her blood pressure was stable. Lab work revealed significant leukocytosis with a WBC count of 21.4 with left shift, abnormal UA suggestive of UTI. Chest x-ray unremarkable for any acute cardiopulmonary pathology. CT head unremarkable for any acute intracranial process. Patient was given IV Cardizem 2 and at present her heart rate is still elevated but improving control. After sending the urine cultures patient was started on IV antibiotics namely ceftriaxone and hospitalist service was consulted for further management. Patient is on DO NOT RESUSCITATE status which is confirmed with her  daughter who is her bedside at this time. Per patient's daughter, patient is more alert with improving mental status now. Does of her baseline dementia, I'm not able to get any history from the patient but according to the patient's daughter who is with the patient's bedside at this time no history of fever or chills, chest pain,shortness of breath, focal weakness or numbness, nausea, vomiting, diarrhea, abdominal pain, dysuria. No history of recent reported illness.   VITAL SIGNS:  Blood pressure 117/54, pulse 106, temperature 97.8 F (36.6 C), temperature source Oral, resp. rate 19, height 5\' 3"  (1.6 m), weight 65.953 kg (145 lb 6.4 oz), SpO2 99 %.  I/O:   Intake/Output Summary (Last 24 hours) at 03/01/15 1052 Last data filed at 03/01/15 1011  Gross per 24 hour  Intake 256.25 ml  Output    371 ml  Net -114.75 ml    PHYSICAL EXAMINATION:  Constitutional: No distress. Demented. HENT:  Head: Normocephalic.  Eyes:  Normal pupillary response  Neck: Neck supple. No tracheal deviation present.  Cardiovascular:  Murmur heard. Rate controlled with irregularly irregular heartbeat  Pulmonary/Chest: Effort normal and breath sounds normal. No respiratory distress. She has no wheezes.  Abdominal: Soft. Bowel sounds are normal. She exhibits no distension. There is no tenderness. There is no rebound.  Musculoskeletal: Normal range of motion. She exhibits no edema or tenderness.  Neurological:  Moves upper limbs slowly, generalized weakness. Skin: Skin is warm. Some echymosis on both arms due to blood collections and IV lines. Psychiatric:  Demented, but calm. DATA REVIEW:   CBC  Recent Labs Lab 02/26/15 2031  WBC 21.4*  HGB 12.4  HCT 37.4  PLT 250    Chemistries   Recent Labs Lab 02/26/15 2031  03/01/15 0436  NA 136  < > 134*  K 3.5  < > 3.4*  CL 98*  < > 100*  CO2 28  < > 26  GLUCOSE 127*  < > 93  BUN 14  < > 12  CREATININE 0.67  < > 0.56  CALCIUM 9.3  < > 8.3*  MG   --   < > 2.0  AST 21  --   --   ALT 13*  --   --   ALKPHOS 48  --   --   BILITOT 0.6  --   --   < > = values in this interval not displayed.  Cardiac Enzymes  Recent Labs Lab 02/27/15 0352  TROPONINI <0.03    Microbiology Results  Results for orders placed or performed during the hospital encounter of 02/26/15  Urine culture     Status: None   Collection Time: 02/26/15  9:29 PM  Result Value Ref Range Status   Specimen Description IN/OUT CATH URINE  Final   Special Requests NONE  Final   Culture >=100,000 COLONIES/mL ESCHERICHIA COLI  Final   Report Status 02/28/2015 FINAL  Final   Organism ID, Bacteria ESCHERICHIA COLI  Final      Susceptibility   Escherichia coli - MIC*    AMPICILLIN 4 SENSITIVE Sensitive     CEFTAZIDIME <=1 SENSITIVE Sensitive     CEFAZOLIN <=4 SENSITIVE Sensitive     CEFTRIAXONE <=1 SENSITIVE Sensitive     CIPROFLOXACIN <=0.25 SENSITIVE Sensitive     GENTAMICIN <=1 SENSITIVE Sensitive     IMIPENEM <=0.25 SENSITIVE Sensitive     TRIMETH/SULFA <=20 SENSITIVE Sensitive     CEFOXITIN <=4 SENSITIVE Sensitive     * >=100,000 COLONIES/mL ESCHERICHIA COLI    Management plans discussed with the patient, family and they are in agreement.  CODE STATUS: DNR  TOTAL TIME TAKING CARE OF THIS PATIENT: 40 minutes.    Altamese DillingVACHHANI, Avary Pitsenbarger M.D on 03/01/2015 at 10:52 AM  Between 7am to 6pm - Pager - [phone]  After 6pm go to [email or web address] - password [email or web address] [facility]  [place] [place] [facility]  Office  [phone]  CC: Primary care physician; Rafael BihariWALKER III, [name], MD

## 2015-03-01 NOTE — Consult Note (Signed)
WOC wound consult note Reason for Consult: Evaluate inframammary dermatitis and trauma wound to left lateral calf, present on admission.  Wound type: Intertriginous dermatitis to bilateral breasts Trauma wound to left lateral calf from skin tear.  Present on admission.  Pressure Ulcer POA: n/a Measurement: 0.1 cm x 6 cm linear redness under bilateral breasts.  Daughter at bedside and indicated that she has been applying powder and lotion at home.    Left lateral calf:  0.5 cm x 0.1 cm skin tear with approximated edges.  Daughter states this has been present for 2 weeks.  Is resolving.  Wound bed: pink and moist Drainage (amount, consistency, odor) None at this time.  No odor.  Periwound:Intact Dressing procedure/placement/frequency:Cleanse under bilateral breasts with soap and water.  Apply Interdry moisture wicking fabric.  Leave in place for 5 days.  Do not wet this fabric. Remove daily for bathing and replace the fabric.   Cleanse left calf skin tear with NS and pat gently dry.  Apply Allevyn silicone border dressing.  Change every 3 days and PRN soilage.   Measure and cut length of InterDry Ag+ to fit in skin folds that have skin breakdown Tuck InterDry  Ag+ fabric into skin folds in a single layer, allow for 2 inches of overhang from skin edges to allow for wicking to occur May remove to bathe; dry area thoroughly and then tuck into affected areas again Do not apply any creams or ointments when using InterDry Ag+ DO NOT THROW AWAY FOR 5 DAYS unless soiled with stool DO NOT [facility] product, this will inactivate the silver in the material  New sheet of Interdry Ag+ should be applied after 5 days of use if patient continues to have skin breakdown   Will not follow at this time.  Please re-consult if needed.  Maple HudsonKaren Riyana Biel RN BSN CWON Pager [phone]

## 2015-10-23 ENCOUNTER — Encounter: Payer: Self-pay | Admitting: Emergency Medicine

## 2015-10-23 ENCOUNTER — Emergency Department: Payer: Medicare PPO

## 2015-10-23 ENCOUNTER — Inpatient Hospital Stay
Admission: EM | Admit: 2015-10-23 | Discharge: 2015-10-28 | DRG: 640 | Disposition: A | Discharge status: Home Health | Payer: Medicare PPO | Attending: Internal Medicine | Admitting: Internal Medicine

## 2015-10-23 DIAGNOSIS — I10 Essential (primary) hypertension: Secondary | ICD-10-CM | POA: Diagnosis present

## 2015-10-23 DIAGNOSIS — Z87891 Personal history of nicotine dependence: Secondary | ICD-10-CM

## 2015-10-23 DIAGNOSIS — Z7982 Long term (current) use of aspirin: Secondary | ICD-10-CM | POA: Diagnosis not present

## 2015-10-23 DIAGNOSIS — E876 Hypokalemia: Secondary | ICD-10-CM | POA: Diagnosis present

## 2015-10-23 DIAGNOSIS — R339 Retention of urine, unspecified: Secondary | ICD-10-CM | POA: Diagnosis present

## 2015-10-23 DIAGNOSIS — Z993 Dependence on wheelchair: Secondary | ICD-10-CM

## 2015-10-23 DIAGNOSIS — Z882 Allergy status to sulfonamides status: Secondary | ICD-10-CM

## 2015-10-23 DIAGNOSIS — R197 Diarrhea, unspecified: Secondary | ICD-10-CM | POA: Diagnosis present

## 2015-10-23 DIAGNOSIS — I482 Chronic atrial fibrillation, unspecified: Secondary | ICD-10-CM | POA: Diagnosis present

## 2015-10-23 DIAGNOSIS — H9190 Unspecified hearing loss, unspecified ear: Secondary | ICD-10-CM | POA: Diagnosis present

## 2015-10-23 DIAGNOSIS — Z885 Allergy status to narcotic agent status: Secondary | ICD-10-CM

## 2015-10-23 DIAGNOSIS — N179 Acute kidney failure, unspecified: Secondary | ICD-10-CM | POA: Diagnosis present

## 2015-10-23 DIAGNOSIS — Z79899 Other long term (current) drug therapy: Secondary | ICD-10-CM | POA: Diagnosis not present

## 2015-10-23 DIAGNOSIS — N170 Acute kidney failure with tubular necrosis: Secondary | ICD-10-CM | POA: Diagnosis present

## 2015-10-23 DIAGNOSIS — I951 Orthostatic hypotension: Secondary | ICD-10-CM | POA: Diagnosis present

## 2015-10-23 DIAGNOSIS — R630 Anorexia: Secondary | ICD-10-CM | POA: Diagnosis present

## 2015-10-23 DIAGNOSIS — E860 Dehydration: Secondary | ICD-10-CM | POA: Diagnosis not present

## 2015-10-23 DIAGNOSIS — F0390 Unspecified dementia without behavioral disturbance: Secondary | ICD-10-CM | POA: Diagnosis present

## 2015-10-23 DIAGNOSIS — R55 Syncope and collapse: Secondary | ICD-10-CM | POA: Diagnosis not present

## 2015-10-23 DIAGNOSIS — Z66 Do not resuscitate: Secondary | ICD-10-CM | POA: Diagnosis present

## 2015-10-23 DIAGNOSIS — K5900 Constipation, unspecified: Secondary | ICD-10-CM | POA: Diagnosis present

## 2015-10-23 DIAGNOSIS — F329 Major depressive disorder, single episode, unspecified: Secondary | ICD-10-CM | POA: Diagnosis present

## 2015-10-23 DIAGNOSIS — I959 Hypotension, unspecified: Secondary | ICD-10-CM

## 2015-10-23 DIAGNOSIS — Z7401 Bed confinement status: Secondary | ICD-10-CM | POA: Diagnosis not present

## 2015-10-23 LAB — CBC WITH DIFFERENTIAL/PLATELET
BASOS PCT: 1 %
Basophils Absolute: 0.1 10*3/uL (ref 0–0.1)
Eosinophils Absolute: 0.4 10*3/uL (ref 0–0.7)
Eosinophils Relative: 3 %
HEMATOCRIT: 39.4 % (ref 35.0–47.0)
HEMOGLOBIN: 13.2 g/dL (ref 12.0–16.0)
Lymphocytes Relative: 19 %
Lymphs Abs: 2.3 10*3/uL (ref 1.0–3.6)
MCH: 30.9 pg (ref 26.0–34.0)
MCHC: 33.5 g/dL (ref 32.0–36.0)
MCV: 92.3 fL (ref 80.0–100.0)
MONO ABS: 0.6 10*3/uL (ref 0.2–0.9)
MONOS PCT: 5 %
NEUTROS ABS: 8.6 10*3/uL — AB (ref 1.4–6.5)
Neutrophils Relative %: 72 %
Platelets: 234 10*3/uL (ref 150–440)
RBC: 4.27 MIL/uL (ref 3.80–5.20)
RDW: 13.2 % (ref 11.5–14.5)
WBC: 12 10*3/uL — ABNORMAL HIGH (ref 3.6–11.0)

## 2015-10-23 LAB — COMPREHENSIVE METABOLIC PANEL
ALK PHOS: 57 U/L (ref 38–126)
ALT: 12 U/L — ABNORMAL LOW (ref 14–54)
ANION GAP: 8 (ref 5–15)
AST: 16 U/L (ref 15–41)
Albumin: 3.6 g/dL (ref 3.5–5.0)
BILIRUBIN TOTAL: 0.7 mg/dL (ref 0.3–1.2)
BUN: 17 mg/dL (ref 6–20)
CALCIUM: 9 mg/dL (ref 8.9–10.3)
CO2: 28 mmol/L (ref 22–32)
Chloride: 99 mmol/L — ABNORMAL LOW (ref 101–111)
Creatinine, Ser: 1.19 mg/dL — ABNORMAL HIGH (ref 0.44–1.00)
GFR, EST AFRICAN AMERICAN: 45 mL/min — AB (ref >60)
GFR, EST NON AFRICAN AMERICAN: 39 mL/min — AB (ref >60)
Glucose, Bld: 120 mg/dL — ABNORMAL HIGH (ref 65–99)
POTASSIUM: 3.3 mmol/L — AB (ref 3.5–5.1)
Sodium: 135 mmol/L (ref 135–145)
TOTAL PROTEIN: 7.1 g/dL (ref 6.5–8.1)

## 2015-10-23 LAB — DIGOXIN LEVEL

## 2015-10-23 LAB — TROPONIN I

## 2015-10-23 MED ORDER — SODIUM CHLORIDE 0.9 % IV BOLUS (SEPSIS)
1000.0000 mL | Freq: Once | INTRAVENOUS | Status: AC
  Administered 2015-10-23: 1000 mL via INTRAVENOUS

## 2015-10-23 NOTE — ED Provider Notes (Signed)
[facility] Emergency Department Provider Note  ____________________________________________  Time seen: Approximately 9:55 PM  I have reviewed the triage vital signs and the nursing notes.   HISTORY  Chief Complaint Loss of Consciousness  EM caveat: The patient's severe dementia unable to recall events  HPI Shannon Williams is a 82 y.o. female presents for evaluation of passing out.  Patient's family provides historythat mother's history of dementia, she was transferring between wheelchair into when she suddenly passed out completely. She will "out cold" for maybe a minute or 2. They "thought she had died" and called 911. The patient then awoke confused and very sleepy. EMS reports that she was hypotensive with them. The patient denies any pain. She has recently had trouble with some constipation for which they were think he have giving her an enema today.   Past Medical History  Diagnosis Date  . Atrial fibrillation (HCC)   . Peripheral edema   . Hypertension   . Dementia   . Pneumonia     Patient Active Problem List   Diagnosis Date Noted  . Acute encephalopathy 02/26/2015  . UTI (lower urinary tract infection) 02/26/2015  . Atrial fibrillation with RVR (HCC) 02/26/2015  . HTN (hypertension) 02/26/2015  . Dementia 02/26/2015  . Encephalopathy acute 02/26/2015    Past Surgical History  Procedure Laterality Date  . Cholecystectomy    . Abdominal hysterectomy    . Eye surgery    . Breast surgery    . Appendectomy    . Fracture surgery      Current Outpatient Rx  Name  Route  Sig  Dispense  Refill  . acetaminophen (TYLENOL) 500 MG tablet   Oral   Take 1,000 mg by mouth every 6 (six) hours as needed for mild pain.          Marland Kitchen. aspirin EC 325 MG tablet   Oral   Take 325 mg by mouth 2 (two) times daily.          . citalopram (CELEXA) 10 MG tablet   Oral   Take 10 mg by mouth at bedtime.          Marland Kitchen. diltiazem (DILACOR XR) 240 MG 24  hr capsule   Oral   Take 240 mg by mouth daily.         Marland Kitchen. docusate sodium (COLACE) 100 MG capsule   Oral   Take 100 mg by mouth 2 (two) times daily as needed for mild constipation.          . furosemide (LASIX) 20 MG tablet   Oral   Take 20 mg by mouth daily at 12 noon.          Marland Kitchen. losartan (COZAAR) 100 MG tablet   Oral   Take 100 mg by mouth daily.           Allergies Sulfa antibiotics and Codeine  Family History  Problem Relation Age of Onset  . Heart Problems Mother   . Cancer Father   . Cancer - Lung Sister     Social History Social History  Substance Use Topics  . Smoking status: Former Games developermoker  . Smokeless tobacco: None  . Alcohol Use: No    Review of Systems Constitutional: No fever/chills Eyes: No visual changes. ENT: No sore throat. Cardiovascular: Denies chest pain. Respiratory: Denies shortness of breath. Gastrointestinal: No abdominal pain.  No nausea, no vomiting.  No diarrhea.   Genitourinary: Negative for dysuria. Musculoskeletal: Negative for back  pain. Skin: Negative for rash. Neurological: Negative for headaches, focal weakness or numbness.  Patient is chronically weak and care for by her daughters.  10-point ROS otherwise negative.  ____________________________________________   PHYSICAL EXAM:  VITAL SIGNS: ED Triage Vitals  Enc Vitals Group     BP 10/23/15 1834 80/51 mmHg     Pulse Rate 10/23/15 1822 64     Resp 10/23/15 1822 20     Temp 10/23/15 1822 97.7 F (36.5 C)     Temp Source 10/23/15 1822 Oral     SpO2 10/23/15 1834 96 %     Weight 10/23/15 1822 151 lb 12.8 oz (68.856 kg)     Height --      Head Cir --      Peak Flow --      Pain Score 10/23/15 1928 10     Pain Loc --      Pain Edu? --      Excl. in GC? --    Constitutional: Alert but disoriented. Pleasant and in no distress. Eyes: Conjunctivae are normal. PERRL. EOMI. Head: Atraumatic. Nose: No congestion/rhinnorhea. Mouth/Throat: Mucous membranes are  notably dry.  Oropharynx non-erythematous. Neck: No stridor.  No cervical spine tenderness. Cardiovascular: Slightly irregular rate, regular rhythm. Grossly normal heart sounds.  Good peripheral circulation. Respiratory: Normal respiratory effort.  No retractions. Lungs CTAB. Gastrointestinal: Soft and nontender. No distention. No abdominal bruits. No CVA tenderness. Musculoskeletal: No lower extremity tenderness nor edema.  No joint effusions. Neurologic:  Normal speech and language. No gross focal neurologic deficits are appreciated.Skin:  Skin is warm, dry and intact. No rash noted. No pronator drift. Moves all extermities without deficit noted.  Psychiatric: Mood and affect are calm. Family reports she did not fall there holding on her the entire time and sister to the ground. ____________________________________________   LABS (all labs ordered are listed, but only abnormal results are displayed)  Labs Reviewed  CBC WITH DIFFERENTIAL/PLATELET - Abnormal; Notable for the following:    WBC 12.0 (*)    Neutro Abs 8.6 (*)    All other components within normal limits  COMPREHENSIVE METABOLIC PANEL - Abnormal; Notable for the following:    Potassium 3.3 (*)    Chloride 99 (*)    Glucose, Bld 120 (*)    Creatinine, Ser 1.19 (*)    ALT 12 (*)    GFR calc non Af Amer 39 (*)    GFR calc Af Amer 45 (*)    All other components within normal limits  DIGOXIN LEVEL - Abnormal; Notable for the following:    Digoxin Level <0.2 (*)    All other components within normal limits  TROPONIN I  URINALYSIS COMPLETEWITH MICROSCOPIC ([facility] ONLY)   ____________________________________________  EKG  Reviewed and interpreted by me at 1830 Atrial fibrillation, heart rate 50 QRS 100 QTc 440 Possible old inferior infarct, no evidence of acute ST elevation. Reviewed and interpreted as atrial fibrillation with slow ventricular response. ____________________________________________  RADIOLOGY  DG  Chest Port 1 View (Final result) Result time: 10/23/15 20:04:47   Final result by Rad Results In Interface (10/23/15 20:04:47)   Narrative:   CLINICAL DATA: 82 year old female reporting no bowel movement in the past 9 days. Syncopal episode while trying to go to the toilet. No associated trauma.  EXAM: PORTABLE CHEST 1 VIEW  COMPARISON: Chest x-ray 02/26/2015.  FINDINGS: Lung volumes are normal. No consolidative airspace disease. No pleural effusions. No pneumothorax. No pulmonary nodule or mass noted. Pulmonary vasculature  and the cardiomediastinal silhouette are within normal limits. Atherosclerosis in the thoracic aorta.  IMPRESSION: 1. No radiographic evidence of acute cardiopulmonary disease. 2. Atherosclerosis.   Electronically Signed By: Trudie Reed M.D. On: 10/23/2015 20:04    ____________________________________________   PROCEDURES  Procedure(s) performed: None  Critical Care performed: No  ____________________________________________   INITIAL IMPRESSION / ASSESSMENT AND PLAN / ED COURSE  Pertinent labs & imaging results that were available during my care of the patient were reviewed by me and considered in my medical decision making (see chart for details).  Patient presents for syncope. No obvious deficits neurologically. She is notably hypotensive and bradycardic, this improved after fluids. We will withhold her diltiazem. Continue to follow the patient clinically and she is slowly improving. No clear indication for CT imaging. Chest x-ray reassuring. I will admit the patient to hospital for observation of syncope and further evaluation. She is showing good response after fluid resuscitation.  Urinalysis pending at time of admission. Family agreeable with plan. ____________________________________________   FINAL CLINICAL IMPRESSION(S) / ED DIAGNOSES  Final diagnoses:  Syncope and collapse  Hypotension, unspecified hypotension type       Sharyn Creamer, MD 10/23/15 2200

## 2015-10-23 NOTE — H&P (Signed)
[facility] - [place] at [facility]   PATIENT NAME: Shannon Williams    MR#:  [id number]  DATE OF BIRTH:  09-03-1922  DATE OF ADMISSION:  10/23/2015  PRIMARY CARE PHYSICIAN: Shannon Bihari, MD   REQUESTING/REFERRING PHYSICIAN: Fanny Williams, M.D.  CHIEF COMPLAINT:   Chief Complaint  Patient presents with  . Loss of Consciousness    HISTORY OF PRESENT ILLNESS:  Shannon Williams  is a 82 y.o. female who presents with syncope. Patient has had decreased by mouth intake over the last week or so, and her family states that she has not had a bowel movement in about 8 days. Today she was insistent that they try to get her up to the commode, which is typically not what they do given that she is wheelchair and bedbound. However, when they got her on the commode today she had a syncopal episode. No seizure-like activity noted. Patient does have a history of chronic A. fib. They called EMS and brought her to the ED for evaluation. Her blood pressure was low on arrival here with systolic in the 90s. She was given some fluids and her blood pressure came up to low 100s systolic pressure. Lab workup and chest x-ray is largely negative except for some AKI. Hospitalists were called for admission.  PAST MEDICAL HISTORY:   Past Medical History  Diagnosis Date  . Atrial fibrillation (HCC)   . Peripheral edema   . Hypertension   . Dementia   . Pneumonia     PAST SURGICAL HISTORY:   Past Surgical History  Procedure Laterality Date  . Cholecystectomy    . Abdominal hysterectomy    . Eye surgery    . Breast surgery    . Appendectomy    . Fracture surgery      SOCIAL HISTORY:   Social History  Substance Use Topics  . Smoking status: Former Games developer  . Smokeless tobacco: Not on file  . Alcohol Use: No    FAMILY HISTORY:   Family History  Problem Relation Age of Onset  . Heart Problems Mother   . Cancer Father   . Cancer - Lung Sister     DRUG ALLERGIES:   Allergies   Allergen Reactions  . Sulfa Antibiotics Itching  . Codeine Anxiety    MEDICATIONS AT HOME:   Prior to Admission medications   Medication Sig Start Date End Date Taking? Authorizing Provider  acetaminophen (TYLENOL) 500 MG tablet Take 1,000 mg by mouth every 6 (six) hours as needed for mild pain.    Yes Historical Provider, MD  aspirin EC 325 MG tablet Take 325 mg by mouth 2 (two) times daily.    Yes Historical Provider, MD  citalopram (CELEXA) 10 MG tablet Take 10 mg by mouth at bedtime.    Yes Historical Provider, MD  diltiazem (DILACOR XR) 240 MG 24 hr capsule Take 240 mg by mouth daily.   Yes Historical Provider, MD  docusate sodium (COLACE) 100 MG capsule Take 100 mg by mouth 2 (two) times daily as needed for mild constipation.    Yes Historical Provider, MD  furosemide (LASIX) 20 MG tablet Take 20 mg by mouth daily at 12 noon.    Yes Historical Provider, MD  losartan (COZAAR) 100 MG tablet Take 100 mg by mouth daily.   Yes Historical Provider, MD    REVIEW OF SYSTEMS:  Review of Systems  Unable to perform ROS: dementia     VITAL SIGNS:   Filed  Vitals:   10/23/15 1855 10/23/15 1929 10/23/15 2000 10/23/15 2130  BP: 89/51 97/55 90/49  97/56  Pulse: 65 65 71 77  Temp:      TempSrc:      Resp: 18 18    Weight:      SpO2: 98% 98% 97% 96%   Wt Readings from Last 3 Encounters:  10/23/15 68.856 kg (151 lb 12.8 oz)  03/01/15 65.953 kg (145 lb 6.4 oz)    PHYSICAL EXAMINATION:  Physical Exam  Vitals reviewed. Constitutional: She appears well-developed and well-nourished. No distress.  HENT:  Head: Normocephalic and atraumatic.  Heart of hearing. Dry mucous membranes  Eyes: Conjunctivae and EOM are normal. Pupils are equal, round, and reactive to light. No scleral icterus.  Neck: Normal range of motion. Neck supple. No JVD present. No thyromegaly present.  Cardiovascular: Normal rate and intact distal pulses.  Exam reveals no gallop and no friction rub.   No murmur  heard. Irregular rhythm  Respiratory: Effort normal and breath sounds normal. No respiratory distress. She has no wheezes. She has no rales.  GI: Soft. Bowel sounds are normal. She exhibits no distension. There is no tenderness.  Musculoskeletal: Normal range of motion. She exhibits no edema.  No arthritis, no gout  Lymphadenopathy:    She has no cervical adenopathy.  Neurological: She is alert. No cranial nerve deficit.  Unable to fully assess due to dementia. No dysarthria, no aphasia  Skin: Skin is warm and dry. No rash noted. No erythema.  Psychiatric:  Unable to assess due to dementia    LABORATORY PANEL:   CBC  Recent Labs Lab 10/23/15 1851  WBC 12.0*  HGB 13.2  HCT 39.4  PLT 234   ------------------------------------------------------------------------------------------------------------------  Chemistries   Recent Labs Lab 10/23/15 1851  NA 135  K 3.3*  CL 99*  CO2 28  GLUCOSE 120*  BUN 17  CREATININE 1.19*  CALCIUM 9.0  AST 16  ALT 12*  ALKPHOS 57  BILITOT 0.7   ------------------------------------------------------------------------------------------------------------------  Cardiac Enzymes  Recent Labs Lab 10/23/15 1851  TROPONINI <0.03   ------------------------------------------------------------------------------------------------------------------  RADIOLOGY:  Dg Chest Port 1 View  10/23/2015  CLINICAL DATA:  82 year old female reporting no bowel movement in the past 9 days. Syncopal episode while trying to go to the toilet. No associated trauma. EXAM: PORTABLE CHEST 1 VIEW COMPARISON:  Chest x-ray 02/26/2015. FINDINGS: Lung volumes are normal. No consolidative airspace disease. No pleural effusions. No pneumothorax. No pulmonary nodule or mass noted. Pulmonary vasculature and the cardiomediastinal silhouette are within normal limits. Atherosclerosis in the thoracic aorta. IMPRESSION: 1.  No radiographic evidence of acute cardiopulmonary  disease. 2. Atherosclerosis. Electronically Signed   By: Shannon Reedaniel  Williams M.D.   On: 10/23/2015 20:04    EKG:   Orders placed or performed during the hospital encounter of 10/23/15  . ED EKG  . ED EKG  . EKG 12-Lead  . EKG 12-Lead    IMPRESSION AND PLAN:  Principal Problem:   AKI (acute kidney injury) (HCC) - likely prerenal due to poor by mouth intake and dehydration. We'll resuscitate with fluids as below, monitor creatinine. Expect improvement. Active Problems:   Syncope - per the history given by the patient's family, sounds like she may very well had a vasovagal episode in the setting of low blood pressure due to dehydration. However, she is in A. fib and has a history of chronic A. fib, and has had episodes of RVR in the past. We will watch her on  telemetry tonight, and get cardiology see her in the morning.   Dehydration - fluids for rehydration   Chronic a-fib (HCC) - continue rate controlling medications   HTN (hypertension) - patient's blood pressure was low in the ED today, we're holding antihypertensives for now.   Constipation - per from his report patient has not had a bowel movement in 8 days. This could very likely be leading to her poor by mouth intake. We'll get an abdominal film to try to assess her stool burden, and then take appropriate measures, including possibly an enema, to help her have a bowel movement.   Dementia - monitor and treat behavior if and when necessary  All the records are reviewed and case discussed with ED provider. Management plans discussed with the patient and/or family.  DVT PROPHYLAXIS: SubQ lovenox  GI PROPHYLAXIS: None  ADMISSION STATUS: Inpatient  CODE STATUS: DO NOT RESUSCITATE  TOTAL TIME TAKING CARE OF THIS PATIENT: 45 minutes.    Magally Vahle FIELDING 10/23/2015, 11:28 PM  [facility]  Office  [phone]  CC: Primary care physician; Shannon Bihari, MD

## 2015-10-23 NOTE — ED Notes (Signed)
Meal and drink given to Pt per MD's order.

## 2015-10-23 NOTE — ED Notes (Signed)
Family was helping pt to toilet and had syncopal episode. Out for 2 min per family. Did not fall. No injury. Pt denies pain.

## 2015-10-23 NOTE — ED Notes (Signed)
Family reports no BM in 9 days. md notified.

## 2015-10-24 ENCOUNTER — Inpatient Hospital Stay: Payer: Medicare PPO

## 2015-10-24 ENCOUNTER — Inpatient Hospital Stay: Admit: 2015-10-24 | Payer: Medicare PPO

## 2015-10-24 ENCOUNTER — Inpatient Hospital Stay
Admit: 2015-10-24 | Discharge: 2015-10-24 | Disposition: A | Discharge status: Home / Self Care | Payer: Medicare PPO | Attending: Internal Medicine | Admitting: Internal Medicine

## 2015-10-24 LAB — BASIC METABOLIC PANEL
ANION GAP: 7 (ref 5–15)
BUN: 16 mg/dL (ref 6–20)
CO2: 26 mmol/L (ref 22–32)
Calcium: 8.6 mg/dL — ABNORMAL LOW (ref 8.9–10.3)
Chloride: 104 mmol/L (ref 101–111)
Creatinine, Ser: 0.91 mg/dL (ref 0.44–1.00)
GFR, EST NON AFRICAN AMERICAN: 54 mL/min — AB (ref >60)
Glucose, Bld: 101 mg/dL — ABNORMAL HIGH (ref 65–99)
POTASSIUM: 3.1 mmol/L — AB (ref 3.5–5.1)
SODIUM: 137 mmol/L (ref 135–145)

## 2015-10-24 LAB — URINALYSIS COMPLETE WITH MICROSCOPIC (ARMC ONLY)
Bilirubin Urine: NEGATIVE
GLUCOSE, UA: NEGATIVE mg/dL
Hgb urine dipstick: NEGATIVE
Ketones, ur: NEGATIVE mg/dL
Nitrite: NEGATIVE
Protein, ur: NEGATIVE mg/dL
SPECIFIC GRAVITY, URINE: 1.012 (ref 1.005–1.030)
pH: 5 (ref 5.0–8.0)

## 2015-10-24 LAB — CBC
HEMATOCRIT: 32.8 % — AB (ref 35.0–47.0)
HEMOGLOBIN: 11.1 g/dL — AB (ref 12.0–16.0)
MCH: 30.6 pg (ref 26.0–34.0)
MCHC: 33.9 g/dL (ref 32.0–36.0)
MCV: 90.1 fL (ref 80.0–100.0)
Platelets: 204 10*3/uL (ref 150–440)
RBC: 3.64 MIL/uL — AB (ref 3.80–5.20)
RDW: 13.3 % (ref 11.5–14.5)
WBC: 10.5 10*3/uL (ref 3.6–11.0)

## 2015-10-24 MED ORDER — SODIUM CHLORIDE 0.9 % IV SOLN
INTRAVENOUS | Status: DC
  Administered 2015-10-24: 01:00:00 via INTRAVENOUS

## 2015-10-24 MED ORDER — DILTIAZEM HCL ER 240 MG PO CP24
240.0000 mg | ORAL_CAPSULE | Freq: Every day | ORAL | Status: DC
  Administered 2015-10-24 – 2015-10-26 (×3): 240 mg via ORAL
  Filled 2015-10-24 (×7): qty 1

## 2015-10-24 MED ORDER — ACETAMINOPHEN 650 MG RE SUPP: 650.0000 mg | Freq: Four times a day (QID) | RECTAL | Status: DC | PRN

## 2015-10-24 MED ORDER — ONDANSETRON HCL 4 MG/2ML IJ SOLN
4.0000 mg | Freq: Four times a day (QID) | INTRAMUSCULAR | Status: DC | PRN
  Administered 2015-10-25: 4 mg via INTRAVENOUS
  Filled 2015-10-24: qty 2

## 2015-10-24 MED ORDER — LACTULOSE 10 GM/15ML PO SOLN
20.0000 g | Freq: Two times a day (BID) | ORAL | Status: DC | PRN
  Administered 2015-10-24: 20 g via ORAL
  Filled 2015-10-24: qty 30

## 2015-10-24 MED ORDER — DOCUSATE SODIUM 100 MG PO CAPS
100.0000 mg | ORAL_CAPSULE | Freq: Two times a day (BID) | ORAL | Status: DC
  Administered 2015-10-24 – 2015-10-25 (×4): 100 mg via ORAL
  Filled 2015-10-24 (×4): qty 1

## 2015-10-24 MED ORDER — CITALOPRAM HYDROBROMIDE 20 MG PO TABS
10.0000 mg | ORAL_TABLET | Freq: Every day | ORAL | Status: DC
  Administered 2015-10-24 – 2015-10-27 (×4): 10 mg via ORAL
  Filled 2015-10-24 (×5): qty 1

## 2015-10-24 MED ORDER — ONDANSETRON HCL 4 MG PO TABS: 4.0000 mg | ORAL_TABLET | Freq: Four times a day (QID) | ORAL | Status: DC | PRN

## 2015-10-24 MED ORDER — ENOXAPARIN SODIUM 40 MG/0.4ML ~~LOC~~ SOLN
40.0000 mg | SUBCUTANEOUS | Status: DC
  Administered 2015-10-24 – 2015-10-27 (×4): 40 mg via SUBCUTANEOUS
  Filled 2015-10-24 (×4): qty 0.4

## 2015-10-24 MED ORDER — ASPIRIN EC 325 MG PO TBEC
325.0000 mg | DELAYED_RELEASE_TABLET | Freq: Two times a day (BID) | ORAL | Status: DC
  Administered 2015-10-24 – 2015-10-28 (×9): 325 mg via ORAL
  Filled 2015-10-24 (×10): qty 1

## 2015-10-24 MED ORDER — ENOXAPARIN SODIUM 40 MG/0.4ML ~~LOC~~ SOLN: 40.0000 mg | SUBCUTANEOUS | Status: DC

## 2015-10-24 MED ORDER — ACETAMINOPHEN 325 MG PO TABS
650.0000 mg | ORAL_TABLET | Freq: Four times a day (QID) | ORAL | Status: DC | PRN
  Administered 2015-10-24 – 2015-10-28 (×4): 650 mg via ORAL
  Filled 2015-10-24 (×5): qty 2

## 2015-10-24 MED ORDER — ENOXAPARIN SODIUM 30 MG/0.3ML ~~LOC~~ SOLN: 30.0000 mg | SUBCUTANEOUS | Status: DC

## 2015-10-24 MED ORDER — POTASSIUM CHLORIDE CRYS ER 20 MEQ PO TBCR
40.0000 meq | EXTENDED_RELEASE_TABLET | Freq: Once | ORAL | Status: AC
  Administered 2015-10-24: 40 meq via ORAL
  Filled 2015-10-24: qty 2

## 2015-10-24 MED ORDER — SODIUM CHLORIDE 0.9 % IJ SOLN
3.0000 mL | Freq: Two times a day (BID) | INTRAMUSCULAR | Status: DC
  Administered 2015-10-24 – 2015-10-27 (×7): 3 mL via INTRAVENOUS

## 2015-10-24 NOTE — Progress Notes (Signed)
[facility] - [place] at [facility]   PATIENT NAME: Shannon Williams    MR#:  [id number]  DATE OF BIRTH:  [birth year]  SUBJECTIVE: 82 year old female admitted for syncope. Found to have constipation with no BM for 8 days also has acute renal failure. Today she has urine retention, had a scan showed about 1100 mL urine. Getting Foley catheter for that. Very small  bowel movement last night. Patient awake and alert very hard of hearing. Grand daughter at the bedside giving all the history.   CHIEF COMPLAINT:   Chief Complaint  Patient presents with  . Loss of Consciousness    REVIEW OF SYSTEMS:    Review of Systems  Constitutional: Positive for fever. Negative for chills.  HENT: Positive for hearing loss.   Eyes: Negative for blurred vision, double vision and photophobia.  Respiratory: Negative for cough, hemoptysis and shortness of breath.   Cardiovascular: Negative for chest pain, palpitations, orthopnea and leg swelling.  Gastrointestinal: Positive for constipation. Negative for vomiting, abdominal pain and diarrhea.  Genitourinary: Negative for dysuria and urgency.  Musculoskeletal: Negative for myalgias and neck pain.  Skin: Negative for rash.  Neurological: Negative for dizziness, focal weakness, seizures, weakness and headaches.  Psychiatric/Behavioral: Negative for memory loss. The patient does not have insomnia.     Nutrition:  Tolerating Diet: Tolerating PT:      DRUG ALLERGIES:   Allergies  Allergen Reactions  . Sulfa Antibiotics Itching  . Codeine Anxiety    VITALS:  Blood pressure 118/65, pulse 94, temperature 98.7 F (37.1 C), temperature source Oral, resp. rate 18, height 4\' 11"  (1.499 m), weight 61.825 kg (136 lb 4.8 oz), SpO2 99 %.  PHYSICAL EXAMINATION:   Physical Exam  GENERAL:  82 y.o.-year-old patient lying in the bed with no acute distress.  EYES: Pupils equal, round, reactive to light and accommodation. No scleral  icterus. Extraocular muscles intact.  HEENT: Head atraumatic, normocephalic. Oropharynx and nasopharynx clear.  NECK:  Supple, no jugular venous distention. No thyroid enlargement, no tenderness.  LUNGS: Normal breath sounds bilaterally, no wheezing, rales,rhonchi or crepitation. No use of accessory muscles of respiration.  CARDIOVASCULAR: S1, S2 normal. No murmurs, rubs, or gallops.  ABDOMEN: Soft, nontender, nondistended. Bowel sounds present. No organomegaly or mass.  EXTREMITIES: No pedal edema, cyanosis, or clubbing.  NEUROLOGIC: Cranial nerves II through XII are intact. Muscle strength 5/5 in all extremities. Sensation intact. Gait not checked.  PSYCHIATRIC: The patient is alert and oriented x 3.  SKIN: No obvious rash, lesion, or ulcer.    LABORATORY PANEL:   CBC  Recent Labs Lab 10/24/15 0513  WBC 10.5  HGB 11.1*  HCT 32.8*  PLT 204   ------------------------------------------------------------------------------------------------------------------  Chemistries   Recent Labs Lab 10/23/15 1851 10/24/15 0513  NA 135 137  K 3.3* 3.1*  CL 99* 104  CO2 28 26  GLUCOSE 120* 101*  BUN 17 16  CREATININE 1.19* 0.91  CALCIUM 9.0 8.6*  AST 16  --   ALT 12*  --   ALKPHOS 57  --   BILITOT 0.7  --    ------------------------------------------------------------------------------------------------------------------  Cardiac Enzymes  Recent Labs Lab 10/23/15 1851  TROPONINI <0.03   ------------------------------------------------------------------------------------------------------------------  RADIOLOGY:  Dg Chest Port 1 View  10/23/2015  CLINICAL DATA:  82 year old female reporting no bowel movement in the past 9 days. Syncopal episode while trying to go to the toilet. No associated trauma. EXAM: PORTABLE CHEST 1 VIEW COMPARISON:  Chest x-ray 02/26/2015. FINDINGS: Lung volumes  are normal. No consolidative airspace disease. No pleural effusions. No pneumothorax. No  pulmonary nodule or mass noted. Pulmonary vasculature and the cardiomediastinal silhouette are within normal limits. Atherosclerosis in the thoracic aorta. IMPRESSION: 1.  No radiographic evidence of acute cardiopulmonary disease. 2. Atherosclerosis. Electronically Signed   By: Trudie Reedaniel  Entrikin M.D.   On: 10/23/2015 20:04   Dg Abd 2 Views  10/24/2015  CLINICAL DATA:  82 year old female with constipation. EXAM: ABDOMEN - 2 VIEW COMPARISON:  CT dated 10/29/2011 FINDINGS: There is moderate stool throughout the colon. No evidence of bowel obstruction. Right upper quadrant cholecystectomy clips. There is osteopenia with scoliosis and degenerative changes of the spine. IMPRESSION: Constipation.  No bowel obstruction. Electronically Signed   By: Elgie CollardArash  Radparvar M.D.   On: 10/24/2015 01:24     ASSESSMENT AND PLAN:   Principal Problem:   AKI (acute kidney injury) (HCC) Active Problems:   Chronic a-fib (HCC)   HTN (hypertension)   Dementia   Syncope   Dehydration   Constipation   Acute renal failure secondary to hypotension: ATN secondary to hypertension: Improved with IV hydration. Hold the t furosemide, losartan at this time. #2 severe constipation: Not better yet. Add Colace, lactulose. #3.syncope: Seen by cardiology. Echo showed EF more than 55% no further cardiac workup is needed. Syncope likely vascular ankle. #4. Hypokalemia getting replacement. #5 chronic atrial fibrillation: Not on anticoagulation due to advanced age. Continue Cardizem CD 240 mg daily. And also on aspirin 325 mg  Dementia/depression **d/w patient's granddaughter.    All the records are reviewed and case discussed with Care Management/Social Workerr. Management plans discussed with the patient, family and they are in agreement.  CODE STATUS: DO NOT RESUSCITATE  TOTAL TIME TAKING CARE OF THIS PATIENT: 35minutes.   POSSIBLE D/C IN 1-2DAYS, DEPENDING ON CLINICAL CONDITION.   Katha HammingKONIDENA,Damarea Merkel M.D on 10/24/2015 at  2:02 PM  Between 7am to 6pm - Pager - [phone]  After 6pm go to [email or web address] - password [email or web address] [facility]  [facility]  Office  [phone]  CC: Primary care physician; Rafael BihariWALKER III, [name], MD

## 2015-10-24 NOTE — Progress Notes (Signed)
Initial Nutrition Assessment   INTERVENTION:   Meals and Snacks: Cater to patient preferences; will recommend Dysphagia III diet order for ease of chewing as per family report, pt needs all meats and foods chopped  Medical Food Supplement Therapy: will send Magic Cup BID   NUTRITION DIAGNOSIS:   Inadequate oral intake related to acute illness as evidenced by per patient/family report.  GOAL:   Patient will meet greater than or equal to 90% of their needs  MONITOR:    (Energy Intake, Anthropometrics, Digestive System, Electrolyte and Renal Profile)  REASON FOR ASSESSMENT:   Diagnosis    ASSESSMENT:   Pt admitted with acute kidney injury. Pt with constipation, per MD note, no BM in 8 days PTA.  Past Medical History  Diagnosis Date  . Atrial fibrillation (HCC)   . Peripheral edema   . Hypertension   . Dementia   . Pneumonia     Diet Order:  DIET DYS 3 Room service appropriate?: Yes; Fluid consistency:: Thin    Current Nutrition: Pt ate bites of Malaw[place]urkey sandwich at lunch on observed tray today  Food/Nutrition-Related History: Pt family member reports pt eating poorly for the past few days PTA, however usually pt with a very good appetite. Pt family member reports pt was eating 3 meals per day with snacks until the past few days.   Scheduled Medications:  . aspirin EC  325 mg Oral BID  . citalopram  10 mg Oral QHS  . diltiazem  240 mg Oral Daily  . docusate sodium  100 mg Oral BID  . enoxaparin (LOVENOX) injection  40 mg Subcutaneous Q24H  . sodium chloride  3 mL Intravenous Q12H    Electrolyte/Renal Profile and Glucose Profile:   Recent Labs Lab 10/23/15 1851 10/24/15 0513  NA 135 137  K 3.3* 3.1*  CL 99* 104  CO2 28 26  BUN 17 16  CREATININE 1.19* 0.91  CALCIUM 9.0 8.6*  GLUCOSE 120* 101*   Protein Profile:   Recent Labs Lab 10/23/15 1851  ALBUMIN 3.6    Gastrointestinal Profile: Last BM: small BM 10/24/2015   Nutrition-Focused Physical  Exam Findings:  Unable to complete Nutrition-Focused physical exam at this time.    Weight Change: Pt family member reports stable weight recently that she is aware. Per family member pt has not been to MD office in a while but doesn't seem to be losing weight. Per last [facility] weight encounter in May 2016 pt with 6% weight loss in 9 months.   Height:   Ht Readings from Last 1 Encounters:  10/24/15 4\' 11"  (1.499 m)    Weight:   Wt Readings from Last 1 Encounters:  10/24/15 136 lb 4.8 oz (61.825 kg)    Wt Readings from Last 10 Encounters:  10/24/15 136 lb 4.8 oz (61.825 kg)  03/01/15 145 lb 6.4 oz (65.953 kg)      BMI:  Body mass index is 27.51 kg/(m^2).   EDUCATION NEEDS:   No education needs identified at this time   LOW Care Level  Leda QuailAllyson Jaslyn Bansal, RD, LDN Pager [phone] Weekend/On-Call Pager [phone]

## 2015-10-24 NOTE — Progress Notes (Signed)
Spoke with Dr. Anne HahnWillis, requested an in and out cath to collect urine.

## 2015-10-24 NOTE — Progress Notes (Signed)
*  PRELIMINARY RESULTS* Echocardiogram 2D Echocardiogram has been performed.  Georgann HousekeeperJerry R Hege 10/24/2015, 8:39 AM

## 2015-10-24 NOTE — Progress Notes (Signed)
Telemetry called and verified patient box.

## 2015-10-24 NOTE — Care Management Note (Signed)
Case Management Note  Patient Details  Name: Shannon Williams MRN: [id number] Date of Birth: August 19, 1922  Subjective/Objective:                  I spoke briefly with patient's daughter Shannon Williams and she referred me to her sister that cares for patient Shannon Deist([name] [phone]). Patient lives with Shannon Williams at [address] [place]; she is her HCPOA. She was with Shannon Williams and has now transferred to [facility] which is a physician home visit program. Shannon Williams has been writing her RX. Patient is wheelchair/bedbound. They have a wheelchair, Hoyer lift and Hospital bed at home. She will require EMS to get home. She was open to [facility] per Shannon Williams but they recently closed her case.   Action/Plan: I have notified Shannon Williams with [facility] of patient's readmission to see if she would meet hospice criteria. RNCM will continue to follow.   Expected Discharge Date:                  Expected Discharge Plan:     In-House Referral:     Discharge planning Services     Post Acute Care Choice:    Choice offered to:  Patient, Adult Children Shannon Quick([name] and Shannon Williams)  DME Arranged:    DME Agency:     HH Arranged:    HH Agency:     Status of Service:  In process, will continue to follow  Medicare Important Message Given:    Date Medicare IM Given:    Medicare IM give by:    Date Additional Medicare IM Given:    Additional Medicare Important Message give by:     If discussed at Long Length of Stay Meetings, dates discussed:    Additional Comments:  Shannon Siadngela Alailah Safley, RN 10/24/2015, 9:09 AM

## 2015-10-24 NOTE — Progress Notes (Signed)
Anticoagulation monitoring(Lovenox):  82 yo  ordered Lovenox 30 mg Q24h  Filed Weights   10/23/15 1822 10/24/15 0100  Weight: 151 lb 12.8 oz (68.856 kg) 136 lb 4.8 oz (61.825 kg)   BMI 27.6  Lab Results  Component Value Date   CREATININE 0.91 10/24/2015   CREATININE 1.19* 10/23/2015   CREATININE 0.56 03/01/2015   Estimated Creatinine Clearance: 32.2 mL/min (by C-G formula based on Cr of 0.91). Hemoglobin & Hematocrit     Component Value Date/Time   HGB 11.1* 10/24/2015 0513   HGB 10.8* 10/17/2013 0505   HCT 32.8* 10/24/2015 0513   HCT 32.5* 10/17/2013 0505     Per Protocol for Patient with estCrcl> 30 ml/min and BMI < 40, will transition to back to Lovenox 40 mg Q24h.

## 2015-10-24 NOTE — Progress Notes (Signed)
Shannon Williams is a 82 y.o. female  [id number]  Primary Cardiologist: Adrian Blackwater Reason for Consultation: Syncope  HPI: This is a 82 year old white female with a past medical history of dehydration and dementia and hearing deficit apparently when she stood up her blood pressure was low at about 90 systolic and passed out yesterday. I was asked to evaluate the patient for possible cardiac causes but patient denies any chest pain shortness of breath orthopnea or leg swelling.   Review of Systems: No chest pain   Past Medical History  Diagnosis Date  . Atrial fibrillation (HCC)   . Peripheral edema   . Hypertension   . Dementia   . Pneumonia     Medications Prior to Admission  Medication Sig Dispense Refill  . acetaminophen (TYLENOL) 500 MG tablet Take 1,000 mg by mouth every 6 (six) hours as needed for mild pain.     Marland Kitchen aspirin EC 325 MG tablet Take 325 mg by mouth 2 (two) times daily.     . citalopram (CELEXA) 10 MG tablet Take 10 mg by mouth at bedtime.     Marland Kitchen diltiazem (DILACOR XR) 240 MG 24 hr capsule Take 240 mg by mouth daily.    Marland Kitchen docusate sodium (COLACE) 100 MG capsule Take 100 mg by mouth 2 (two) times daily as needed for mild constipation.     . furosemide (LASIX) 20 MG tablet Take 20 mg by mouth daily at 12 noon.     Marland Kitchen losartan (COZAAR) 100 MG tablet Take 100 mg by mouth daily.       Marland Kitchen aspirin EC  325 mg Oral BID  . citalopram  10 mg Oral QHS  . diltiazem  240 mg Oral Daily  . enoxaparin (LOVENOX) injection  40 mg Subcutaneous Q24H  . sodium chloride  3 mL Intravenous Q12H    Infusions:    Allergies  Allergen Reactions  . Sulfa Antibiotics Itching  . Codeine Anxiety    Social History   Social History  . Marital Status: Single    Spouse Name: N/A  . Number of Children: N/A  . Years of Education: N/A   Occupational History  . Not on file.   Social History Main Topics  . Smoking status: Former Games developer  . Smokeless tobacco: Not on file  .  Alcohol Use: No  . Drug Use: No  . Sexual Activity: No   Other Topics Concern  . Not on file   Social History Narrative    Family History  Problem Relation Age of Onset  . Heart Problems Mother   . Cancer Father   . Cancer - Lung Sister     PHYSICAL EXAM: Filed Vitals:   10/24/15 0808 10/24/15 1212  BP: 130/55 118/65  Pulse: 117 94  Temp: 98.1 F (36.7 C) 98.7 F (37.1 C)  Resp: 18 18     Intake/Output Summary (Last 24 hours) at 10/24/15 1313 Last data filed at 10/24/15 0810  Gross per 24 hour  Intake    410 ml  Output   1300 ml  Net   -890 ml    General:  Well appearing. No respiratory difficulty HEENT: normal Neck: supple. no JVD. Carotids 2+ bilat; no bruits. No lymphadenopathy or thryomegaly appreciated. Cor: PMI nondisplaced. Regular rate & rhythm. No rubs, gallops or murmurs. Lungs: clear Abdomen: soft, nontender, nondistended. No hepatosplenomegaly. No bruits or masses. Good bowel sounds. Extremities: no cyanosis, clubbing, rash, edema Neuro: alert & oriented x 3,  cranial nerves grossly intact. moves all 4 extremities w/o difficulty. Affect pleasant.  ECG: Sinus sinus rhythm with no acute EKG changes  Results for orders placed or performed during the hospital encounter of 10/23/15 (from the past 24 hour(s))  CBC with Differential     Status: Abnormal   Collection Time: 10/23/15  6:51 PM  Result Value Ref Range   WBC 12.0 (H) 3.6 - 11.0 K/uL   RBC 4.27 3.80 - 5.20 MIL/uL   Hemoglobin 13.2 12.0 - 16.0 g/dL   HCT 54.0 98.1 - 19.1 %   MCV 92.3 80.0 - 100.0 fL   MCH 30.9 26.0 - 34.0 pg   MCHC 33.5 32.0 - 36.0 g/dL   RDW 47.8 29.5 - 62.1 %   Platelets 234 150 - 440 K/uL   Neutrophils Relative % 72 %   Neutro Abs 8.6 (H) 1.4 - 6.5 K/uL   Lymphocytes Relative 19 %   Lymphs Abs 2.3 1.0 - 3.6 K/uL   Monocytes Relative 5 %   Monocytes Absolute 0.6 0.2 - 0.9 K/uL   Eosinophils Relative 3 %   Eosinophils Absolute 0.4 0 - 0.7 K/uL   Basophils Relative 1 %    Basophils Absolute 0.1 0 - 0.1 K/uL  Comprehensive metabolic panel     Status: Abnormal   Collection Time: 10/23/15  6:51 PM  Result Value Ref Range   Sodium 135 135 - 145 mmol/L   Potassium 3.3 (L) 3.5 - 5.1 mmol/L   Chloride 99 (L) 101 - 111 mmol/L   CO2 28 22 - 32 mmol/L   Glucose, Bld 120 (H) 65 - 99 mg/dL   BUN 17 6 - 20 mg/dL   Creatinine, Ser 3.08 (H) 0.44 - 1.00 mg/dL   Calcium 9.0 8.9 - 65.7 mg/dL   Total Protein 7.1 6.5 - 8.1 g/dL   Albumin 3.6 3.5 - 5.0 g/dL   AST 16 15 - 41 U/L   ALT 12 (L) 14 - 54 U/L   Alkaline Phosphatase 57 38 - 126 U/L   Total Bilirubin 0.7 0.3 - 1.2 mg/dL   GFR calc non Af Amer 39 (L) >60 mL/min   GFR calc Af Amer 45 (L) >60 mL/min   Anion gap 8 5 - 15  Digoxin level     Status: Abnormal   Collection Time: 10/23/15  6:51 PM  Result Value Ref Range   Digoxin Level <0.2 (L) 0.8 - 2.0 ng/mL  Troponin I     Status: None   Collection Time: 10/23/15  6:51 PM  Result Value Ref Range   Troponin I <0.03 <0.031 ng/mL  Urinalysis complete, with microscopic ([facility] only)     Status: Abnormal   Collection Time: 10/24/15  1:45 AM  Result Value Ref Range   Color, Urine YELLOW (A) YELLOW   APPearance CLEAR (A) CLEAR   Glucose, UA NEGATIVE NEGATIVE mg/dL   Bilirubin Urine NEGATIVE NEGATIVE   Ketones, ur NEGATIVE NEGATIVE mg/dL   Specific Gravity, Urine 1.012 1.005 - 1.030   Hgb urine dipstick NEGATIVE NEGATIVE   pH 5.0 5.0 - 8.0   Protein, ur NEGATIVE NEGATIVE mg/dL   Nitrite NEGATIVE NEGATIVE   Leukocytes, UA 1+ (A) NEGATIVE   RBC / HPF 0-5 0 - 5 RBC/hpf   WBC, UA 6-30 0 - 5 WBC/hpf   Bacteria, UA RARE (A) NONE SEEN   Squamous Epithelial / LPF 0-5 (A) NONE SEEN  Basic metabolic panel     Status: Abnormal   Collection  Time: 10/24/15  5:13 AM  Result Value Ref Range   Sodium 137 135 - 145 mmol/L   Potassium 3.1 (L) 3.5 - 5.1 mmol/L   Chloride 104 101 - 111 mmol/L   CO2 26 22 - 32 mmol/L   Glucose, Bld 101 (H) 65 - 99 mg/dL   BUN 16 6 - 20 mg/dL    Creatinine, Ser 1.910.91 0.44 - 1.00 mg/dL   Calcium 8.6 (L) 8.9 - 10.3 mg/dL   GFR calc non Af Amer 54 (L) >60 mL/min   GFR calc Af Amer >60 >60 mL/min   Anion gap 7 5 - 15  CBC     Status: Abnormal   Collection Time: 10/24/15  5:13 AM  Result Value Ref Range   WBC 10.5 3.6 - 11.0 K/uL   RBC 3.64 (L) 3.80 - 5.20 MIL/uL   Hemoglobin 11.1 (L) 12.0 - 16.0 g/dL   HCT 47.832.8 (L) 29.535.0 - 62.147.0 %   MCV 90.1 80.0 - 100.0 fL   MCH 30.6 26.0 - 34.0 pg   MCHC 33.9 32.0 - 36.0 g/dL   RDW 30.813.3 65.711.5 - 84.614.5 %   Platelets 204 150 - 440 K/uL   Dg Chest Port 1 View  10/23/2015  CLINICAL DATA:  82 year old female reporting no bowel movement in the past 9 days. Syncopal episode while trying to go to the toilet. No associated trauma. EXAM: PORTABLE CHEST 1 VIEW COMPARISON:  Chest x-ray 02/26/2015. FINDINGS: Lung volumes are normal. No consolidative airspace disease. No pleural effusions. No pneumothorax. No pulmonary nodule or mass noted. Pulmonary vasculature and the cardiomediastinal silhouette are within normal limits. Atherosclerosis in the thoracic aorta. IMPRESSION: 1.  No radiographic evidence of acute cardiopulmonary disease. 2. Atherosclerosis. Electronically Signed   By: Trudie Reedaniel  Entrikin M.D.   On: 10/23/2015 20:04   Dg Abd 2 Views  10/24/2015  CLINICAL DATA:  82 year old female with constipation. EXAM: ABDOMEN - 2 VIEW COMPARISON:  CT dated 10/29/2011 FINDINGS: There is moderate stool throughout the colon. No evidence of bowel obstruction. Right upper quadrant cholecystectomy clips. There is osteopenia with scoliosis and degenerative changes of the spine. IMPRESSION: Constipation.  No bowel obstruction. Electronically Signed   By: Elgie CollardArash  Radparvar M.D.   On: 10/24/2015 01:24     ASSESSMENT AND PLAN: Syncope most likely due to hypotension and orthostatic changes with echocardiogram showing normal left ventricular systolic function and cardiac enzymes being negative in no acute EKG changes. There is no  obvious cardiac cause for syncope.  Vidya Bamford A

## 2015-10-25 LAB — BASIC METABOLIC PANEL
Anion gap: 8 (ref 5–15)
BUN: 11 mg/dL (ref 6–20)
CALCIUM: 8.8 mg/dL — AB (ref 8.9–10.3)
CO2: 26 mmol/L (ref 22–32)
CREATININE: 0.7 mg/dL (ref 0.44–1.00)
Chloride: 106 mmol/L (ref 101–111)
GFR calc Af Amer: >60 mL/min (ref >60)
Glucose, Bld: 94 mg/dL (ref 65–99)
POTASSIUM: 3.1 mmol/L — AB (ref 3.5–5.1)
SODIUM: 140 mmol/L (ref 135–145)

## 2015-10-25 MED ORDER — DEXTROSE 5 % IV SOLN
1.0000 g | Freq: Every morning | INTRAVENOUS | Status: DC
  Administered 2015-10-25 – 2015-10-26 (×2): 1 g via INTRAVENOUS
  Filled 2015-10-25 (×3): qty 10

## 2015-10-25 MED ORDER — POLYETHYLENE GLYCOL 3350 17 G PO PACK: 17.0000 g | PACK | Freq: Every day | ORAL | Status: DC | PRN

## 2015-10-25 MED ORDER — LACTULOSE 10 GM/15ML PO SOLN
30.0000 g | Freq: Three times a day (TID) | ORAL | Status: DC
  Administered 2015-10-25 (×3): 30 g via ORAL
  Filled 2015-10-25 (×3): qty 60

## 2015-10-25 MED ORDER — POTASSIUM CHLORIDE CRYS ER 20 MEQ PO TBCR
40.0000 meq | EXTENDED_RELEASE_TABLET | Freq: Once | ORAL | Status: AC
  Administered 2015-10-25: 40 meq via ORAL
  Filled 2015-10-25: qty 2

## 2015-10-25 MED ORDER — FLEET ENEMA 7-19 GM/118ML RE ENEM
1.0000 | ENEMA | Freq: Once | RECTAL | Status: AC
  Administered 2015-10-25: 1 via RECTAL

## 2015-10-25 NOTE — Progress Notes (Signed)
Patient ID: Shannon Williams, female   DOB: 02-Aug-1922, 82 y.o.   MRN: [id number] [facility] PROGRESS NOTE  PCP: Rafael Bihari, MD  HPI/Subjective: Patient seen earlier in awakened from sleep. She states that she does not feel well. She had a small bowel movement. Needed a Foley for urinary retention. Recently was on Levaquin for urinary infection.  Objective: Filed Vitals:   10/25/15 1232 10/25/15 1605  BP: 132/55 110/70  Pulse: 97 99  Temp: 96.7 F (35.9 C) 99 F (37.2 C)  Resp: 18     Filed Weights   10/23/15 1822 10/24/15 0100  Weight: 68.856 kg (151 lb 12.8 oz) 61.825 kg (136 lb 4.8 oz)    ROS: Review of Systems  Constitutional: Negative for fever and chills.  Eyes: Negative for blurred vision.  Respiratory: Negative for cough and shortness of breath.   Cardiovascular: Negative for chest pain.  Gastrointestinal: Positive for constipation. Negative for nausea, vomiting, abdominal pain and diarrhea.  Musculoskeletal: Negative for joint pain.  Neurological: Negative for dizziness and headaches.   Exam: Physical Exam  HENT:  Nose: No mucosal edema.  Mouth/Throat: No oropharyngeal exudate or posterior oropharyngeal edema.  Eyes: Conjunctivae, EOM and lids are normal. Pupils are equal, round, and reactive to light.  Neck: No JVD present. Carotid bruit is not present. No edema present. No thyroid mass and no thyromegaly present.  Cardiovascular: Regular rhythm, S1 normal and S2 normal.  Exam reveals no gallop.   No murmur heard. Pulses:      Dorsalis pedis pulses are 2+ on the right side, and 2+ on the left side.  Respiratory: No respiratory distress. She has decreased breath sounds in the right lower field and the left lower field. She has no wheezes. She has no rhonchi. She has no rales.  GI: Soft. Bowel sounds are normal. There is no tenderness.  Musculoskeletal:       Right ankle: She exhibits swelling.       Left ankle: She exhibits swelling.   Lymphadenopathy:    She has no cervical adenopathy.  Neurological: She is alert. No cranial nerve deficit.  Skin: Skin is warm. No rash noted. Nails show no clubbing.  Psychiatric: She has a normal mood and affect.    Data Reviewed: Basic Metabolic Panel:  Recent Labs Lab 10/23/15 1851 10/24/15 0513 10/25/15 0332  NA 135 137 140  K 3.3* 3.1* 3.1*  CL 99* 104 106  CO2 28 26 26   GLUCOSE 120* 101* 94  BUN 17 16 11   CREATININE 1.19* 0.91 0.70  CALCIUM 9.0 8.6* 8.8*   Liver Function Tests:  Recent Labs Lab 10/23/15 1851  AST 16  ALT 12*  ALKPHOS 57  BILITOT 0.7  PROT 7.1  ALBUMIN 3.6   CBC:  Recent Labs Lab 10/23/15 1851 10/24/15 0513  WBC 12.0* 10.5  NEUTROABS 8.6*  --   HGB 13.2 11.1*  HCT 39.4 32.8*  MCV 92.3 90.1  PLT 234 204   Cardiac Enzymes:  Recent Labs Lab 10/23/15 1851  TROPONINI <0.03     Studies: Dg Chest Port 1 View  10/23/2015  CLINICAL DATA:  82 year old female reporting no bowel movement in the past 9 days. Syncopal episode while trying to go to the toilet. No associated trauma. EXAM: PORTABLE CHEST 1 VIEW COMPARISON:  Chest x-ray 02/26/2015. FINDINGS: Lung volumes are normal. No consolidative airspace disease. No pleural effusions. No pneumothorax. No pulmonary nodule or mass noted. Pulmonary vasculature and the cardiomediastinal silhouette  are within normal limits. Atherosclerosis in the thoracic aorta. IMPRESSION: 1.  No radiographic evidence of acute cardiopulmonary disease. 2. Atherosclerosis. Electronically Signed   By: Trudie Reedaniel  Entrikin M.D.   On: 10/23/2015 20:04   Dg Abd 2 Views  10/24/2015  CLINICAL DATA:  82 year old female with constipation. EXAM: ABDOMEN - 2 VIEW COMPARISON:  CT dated 10/29/2011 FINDINGS: There is moderate stool throughout the colon. No evidence of bowel obstruction. Right upper quadrant cholecystectomy clips. There is osteopenia with scoliosis and degenerative changes of the spine. IMPRESSION: Constipation.  No  bowel obstruction. Electronically Signed   By: Elgie CollardArash  Radparvar M.D.   On: 10/24/2015 01:24    Scheduled Meds: . aspirin EC  325 mg Oral BID  . cefTRIAXone (ROCEPHIN)  IV  1 g Intravenous q morning - 10a  . citalopram  10 mg Oral QHS  . diltiazem  240 mg Oral Daily  . docusate sodium  100 mg Oral BID  . enoxaparin (LOVENOX) injection  40 mg Subcutaneous Q24H  . lactulose  30 g Oral TID  . sodium chloride  3 mL Intravenous Q12H    Assessment/Plan:  1. Acute renal failure, relative hypotension and syncope. Improved with hydration 2. Severe constipation- lactulose until bowel movements, Fleet enemas 3. Urinary retention secondary to severe constipation. I will try to take out Foley once bowel movements obtained 4. Hypokalemia replace potassium 5. Urine analysis placed yesterday was positive. I will empirically put on Rocephin. Patient recently got over a urinary tract infection and was on Levaquin. Likely this was present on admission but urine analysis not sent from the ER. 6. Chronic atrial fibrillation on Cardizem 7. Dementia  Code Status:     Code Status Orders        Start     Ordered   10/24/15 0013  Do not attempt resuscitation (DNR)   Continuous    Question Answer Comment  In the event of cardiac or respiratory ARREST Do not call a "code blue"   In the event of cardiac or respiratory ARREST Do not perform Intubation, CPR, defibrillation or ACLS   In the event of cardiac or respiratory ARREST Use medication by any route, position, wound care, and other measures to relive pain and suffering. May use oxygen, suction and manual treatment of airway obstruction as needed for comfort.      10/24/15 0012    Code Status History    Date Active Date Inactive Code Status Order ID Comments User Context   02/27/2015  2:03 AM 03/01/2015  5:38 PM DNR [id number]  Crissie FiguresEdavally N Reddy, MD Inpatient    Advance Directive Documentation        Most Recent Value   Type of Advance Directive   Healthcare Power of Attorney   Pre-existing out of facility DNR order (yellow form or pink MOST form)     "MOST" Form in Place?       Family Communication: Daughter at the bedside  Disposition Plan: Home soon  Antibiotics:  Rocephin  Time spent:  25 minutes  Alford HighlandWIETING, Jessina Marse  [facility]

## 2015-10-25 NOTE — Progress Notes (Signed)
SUBJECTIVE: Patient is much more alert but still confused   Filed Vitals:   10/24/15 1522 10/24/15 1525 10/24/15 1953 10/25/15 0000  BP: 133/42 129/72 130/69 136/76  Pulse: 100 89 105 104  Temp: 98.4 F (36.9 C)  98.2 F (36.8 C) 98.1 F (36.7 C)  TempSrc: Oral  Oral Oral  Resp: 17  18 18   Height:      Weight:      SpO2: 96%  93% 97%    Intake/Output Summary (Last 24 hours) at 10/25/15 0344 Last data filed at 10/25/15 0027  Gross per 24 hour  Intake    533 ml  Output   1325 ml  Net   -792 ml    LABS: Basic Metabolic Panel:  Recent Labs  16/07/9600/09/17 1851 10/24/15 0513  NA 135 137  K 3.3* 3.1*  CL 99* 104  CO2 28 26  GLUCOSE 120* 101*  BUN 17 16  CREATININE 1.19* 0.91  CALCIUM 9.0 8.6*   Liver Function Tests:  Recent Labs  10/23/15 1851  AST 16  ALT 12*  ALKPHOS 57  BILITOT 0.7  PROT 7.1  ALBUMIN 3.6   No results for input(s): LIPASE, AMYLASE in the last 72 hours. CBC:  Recent Labs  10/23/15 1851 10/24/15 0513  WBC 12.0* 10.5  NEUTROABS 8.6*  --   HGB 13.2 11.1*  HCT 39.4 32.8*  MCV 92.3 90.1  PLT 234 204   Cardiac Enzymes:  Recent Labs  10/23/15 1851  TROPONINI <0.03   BNP: Invalid input(s): POCBNP D-Dimer: No results for input(s): DDIMER in the last 72 hours. Hemoglobin A1C: No results for input(s): HGBA1C in the last 72 hours. Fasting Lipid Panel: No results for input(s): CHOL, HDL, LDLCALC, TRIG, CHOLHDL, LDLDIRECT in the last 72 hours. Thyroid Function Tests: No results for input(s): TSH, T4TOTAL, T3FREE, THYROIDAB in the last 72 hours.  Invalid input(s): FREET3 Anemia Panel: No results for input(s): VITAMINB12, FOLATE, FERRITIN, TIBC, IRON, RETICCTPCT in the last 72 hours.   PHYSICAL EXAM General: Well developed, well nourished, in no acute distress HEENT:  Normocephalic and atramatic Neck:  No JVD.  Lungs: Clear bilaterally to auscultation and percussion. Heart: HRRR . Normal S1 and S2 without gallops or murmurs.   Abdomen: Bowel sounds are positive, abdomen soft and non-tender  Msk:  Back normal, normal gait. Normal strength and tone for age. Extremities: No clubbing, cyanosis or edema.   Neuro: Alert and oriented X 3. Psych:  Good affect, responds appropriately  TELEMETRY: Sinus rhythm  ASSESSMENT AND PLAN: Syncope most likely due to orthostatic hypotension and dehydration with echocardiogram showing normal left reticular systolic function.  Principal Problem:   AKI (acute kidney injury) (HCC) Active Problems:   Chronic a-fib (HCC)   HTN (hypertension)   Dementia   Syncope   Dehydration   Constipation    Tison Leibold A, MD, [facility] 10/25/2015 3:44 AM

## 2015-10-25 NOTE — Care Management Important Message (Signed)
Important Message  Patient Details  Name: Shannon Williams MRN: [id number] Date of Birth: [birth year]   Medicare Important Message Given:  Yes    Olegario MessierKathy A Denys Labree 10/25/2015, 3:17 PM

## 2015-10-26 MED ORDER — SODIUM CHLORIDE 0.9 % IV BOLUS (SEPSIS)
500.0000 mL | Freq: Once | INTRAVENOUS | Status: AC
  Administered 2015-10-26: 500 mL via INTRAVENOUS

## 2015-10-26 MED ORDER — PROMETHAZINE HCL 25 MG/ML IJ SOLN
12.5000 mg | Freq: Once | INTRAMUSCULAR | Status: AC
  Administered 2015-10-26: 12.5 mg via INTRAVENOUS
  Filled 2015-10-26: qty 1

## 2015-10-26 MED ORDER — FLEET ENEMA 7-19 GM/118ML RE ENEM: 1.0000 | ENEMA | Freq: Every morning | RECTAL | Status: DC

## 2015-10-26 MED ORDER — MAGNESIUM OXIDE 400 (241.3 MG) MG PO TABS
400.0000 mg | ORAL_TABLET | Freq: Every day | ORAL | Status: DC
  Administered 2015-10-26 – 2015-10-28 (×3): 400 mg via ORAL
  Filled 2015-10-26 (×3): qty 1

## 2015-10-26 MED ORDER — POTASSIUM CHLORIDE CRYS ER 20 MEQ PO TBCR
20.0000 meq | EXTENDED_RELEASE_TABLET | Freq: Once | ORAL | Status: AC
  Administered 2015-10-26: 20 meq via ORAL
  Filled 2015-10-26: qty 1

## 2015-10-26 MED ORDER — DILTIAZEM HCL 25 MG/5ML IV SOLN
10.0000 mg | Freq: Once | INTRAVENOUS | Status: AC
  Administered 2015-10-26: 10 mg via INTRAVENOUS
  Filled 2015-10-26: qty 5

## 2015-10-26 NOTE — Progress Notes (Signed)
Incentive spirometer giving to patient. Patient demonstrates correct use.

## 2015-10-26 NOTE — Care Management (Addendum)
Called to room by patient's daughter requesting HHPT, HHRN and CNA. She states she has used [facility] and would like to have them again. Referral made to [facility] with [facility]. Patient's daughter approached me (1500) stating that "patient may go home with Foley possibly today". I have updated Barbara CowerJason with [facility] of this potential need.

## 2015-10-26 NOTE — Progress Notes (Signed)
Patient n/v PRN medication giving no relief. Patient HR elevated. Prime Doc notified. Dr.Hower notified new orders giving.

## 2015-10-26 NOTE — Progress Notes (Signed)
Patient HR elevated to 150's non sustained to high 130's maintaing. Prime Doc notified. Dr. Clint GuyHower notified new orders giving. Nursing Supervisor Aleasha notified and will come administer.

## 2015-10-26 NOTE — Progress Notes (Signed)
SUBJECTIVE: Patient is more alert but still confused   Filed Vitals:   10/26/15 0100 10/26/15 0224 10/26/15 0428 10/26/15 0813  BP: 142/91 108/52 120/66 134/61  Pulse: 124 130 106 109  Temp:   98.4 F (36.9 C) 98.2 F (36.8 C)  TempSrc:   Oral Oral  Resp:   18 18  Height:      Weight:      SpO2: 95% 92% 95% 96%    Intake/Output Summary (Last 24 hours) at 10/26/15 0850 Last data filed at 10/26/15 0430  Gross per 24 hour  Intake    483 ml  Output    900 ml  Net   -417 ml    LABS: Basic Metabolic Panel:  Recent Labs  16/07/9600/10/17 0513 10/25/15 0332  NA 137 140  K 3.1* 3.1*  CL 104 106  CO2 26 26  GLUCOSE 101* 94  BUN 16 11  CREATININE 0.91 0.70  CALCIUM 8.6* 8.8*   Liver Function Tests:  Recent Labs  10/23/15 1851  AST 16  ALT 12*  ALKPHOS 57  BILITOT 0.7  PROT 7.1  ALBUMIN 3.6   No results for input(s): LIPASE, AMYLASE in the last 72 hours. CBC:  Recent Labs  10/23/15 1851 10/24/15 0513  WBC 12.0* 10.5  NEUTROABS 8.6*  --   HGB 13.2 11.1*  HCT 39.4 32.8*  MCV 92.3 90.1  PLT 234 204   Cardiac Enzymes:  Recent Labs  10/23/15 1851  TROPONINI <0.03   BNP: Invalid input(s): POCBNP D-Dimer: No results for input(s): DDIMER in the last 72 hours. Hemoglobin A1C: No results for input(s): HGBA1C in the last 72 hours. Fasting Lipid Panel: No results for input(s): CHOL, HDL, LDLCALC, TRIG, CHOLHDL, LDLDIRECT in the last 72 hours. Thyroid Function Tests: No results for input(s): TSH, T4TOTAL, T3FREE, THYROIDAB in the last 72 hours.  Invalid input(s): FREET3 Anemia Pa PHYSICAL EXAM General: Well developed, well nourished, in no acute distress HEENT:  Normocephalic and atramatic Neck:  No JVD.  Lungs: Clear bilaterally to auscultation and percussion. Heart: HRRR . Normal S1 and S2 without gallops or murmurs.  Abdomen: Bowel sounds are positive, abdomen soft and non-tender  Msk:  Back normal, normal gait. Normal strength and tone for  age. Extremities: No clubbing, cyanosis or edema.   Neuro: Alert and oriented X 3. Psych:  Good affect, responds appropriately  TELEMETRY: Not on monitor  ASSESSMENT AND PLAN: Syncope due to orthostatic hypotension secondary to dehydration clinically doing much better now. No sign off.  Principal Problem:   AKI (acute kidney injury) (HCC) Active Problems:   Chronic a-fib (HCC)   HTN (hypertension)   Dementia   Syncope   Dehydration   Constipation    Shannon Williams A, MD, [facility] 10/26/2015 8:50 AM

## 2015-10-26 NOTE — Progress Notes (Signed)
Patient resting between care. Patient remains alert to self. Bowel prep given with positive results. No acute distress noted. Cardiac monitoring intact.

## 2015-10-26 NOTE — Progress Notes (Signed)
Patient ID: Shannon Williams, female   DOB: [birth year], 82 y.o.   MRN: [id number] [facility] PRO[facility]  PCP: Rafael BihariWALKER III, [name], MD  HPI/Subjective: Patient seen earlier in awakened from sleep. Patient has had quite a few bowel movements. Foley catheter was removed this morning and patient still has not urinated yet.  Objective: Filed Vitals:   10/26/15 0813 10/26/15 1150  BP: 134/61 94/37  Pulse: 109 74  Temp: 98.2 F (36.8 C) 98.3 F (36.8 C)  Resp: 18 18    Filed Weights   10/23/15 1822 10/24/15 0100  Weight: 68.856 kg (151 lb 12.8 oz) 61.825 kg (136 lb 4.8 oz)    ROS: Review of Systems  Constitutional: Negative for fever and chills.  Eyes: Negative for blurred vision.  Respiratory: Negative for cough and shortness of breath.   Cardiovascular: Negative for chest pain.  Gastrointestinal: Negative for nausea, vomiting, abdominal pain, diarrhea and constipation.  Musculoskeletal: Negative for joint pain.  Neurological: Negative for dizziness and headaches.   Exam: Physical Exam  HENT:  Nose: No mucosal edema.  Mouth/Throat: No oropharyngeal exudate or posterior oropharyngeal edema.  Eyes: Conjunctivae, EOM and lids are normal. Pupils are equal, round, and reactive to light.  Neck: No JVD present. Carotid bruit is not present. No edema present. No thyroid mass and no thyromegaly present.  Cardiovascular: Regular rhythm, S1 normal and S2 normal.  Exam reveals no gallop.   No murmur heard. Pulses:      Dorsalis pedis pulses are 2+ on the right side, and 2+ on the left side.  Respiratory: No respiratory distress. She has decreased breath sounds in the right lower field and the left lower field. She has no wheezes. She has no rhonchi. She has no rales.  GI: Soft. Bowel sounds are normal. There is no tenderness.  Musculoskeletal:       Right ankle: She exhibits swelling.       Left ankle: She exhibits swelling.  Lymphadenopathy:    She has no cervical  adenopathy.  Neurological: She is alert. No cranial nerve deficit.  Skin: Skin is warm. No rash noted. Nails show no clubbing.  Psychiatric: She has a normal mood and affect.    Data Reviewed: Basic Metabolic Panel:  Recent Labs Lab 10/23/15 1851 10/24/15 0513 10/25/15 0332  NA 135 137 140  K 3.3* 3.1* 3.1*  CL 99* 104 106  CO2 28 26 26   GLUCOSE 120* 101* 94  BUN 17 16 11   CREATININE 1.19* 0.91 0.70  CALCIUM 9.0 8.6* 8.8*   Liver Function Tests:  Recent Labs Lab 10/23/15 1851  AST 16  ALT 12*  ALKPHOS 57  BILITOT 0.7  PROT 7.1  ALBUMIN 3.6   CBC:  Recent Labs Lab 10/23/15 1851 10/24/15 0513  WBC 12.0* 10.5  NEUTROABS 8.6*  --   HGB 13.2 11.1*  HCT 39.4 32.8*  MCV 92.3 90.1  PLT 234 204   Cardiac Enzymes:  Recent Labs Lab 10/23/15 1851  TROPONINI <0.03    Scheduled Meds: . aspirin EC  325 mg Oral BID  . cefTRIAXone (ROCEPHIN)  IV  1 g Intravenous q morning - 10a  . citalopram  10 mg Oral QHS  . diltiazem  240 mg Oral Daily  . docusate sodium  100 mg Oral BID  . enoxaparin (LOVENOX) injection  40 mg Subcutaneous Q24H  . sodium chloride  500 mL Intravenous Once  . sodium chloride  3 mL Intravenous Q12H  Assessment/Plan:  1. Acute renal failure, relative hypotension and syncope. Improved with hydration 2. Severe constipation- improved. Colace to prevent constipation 3. Urinary retention secondary to severe constipation. Foley catheter removed and still has not urinated yet I will give a fluid bolus 4. Hypokalemia replace potassium 5. Urine analysis placed yesterday was positive. I will empirically put on Rocephin. Patient recently got over a urinary tract infection and was on Levaquin. Likely this was present on admission but urine analysis not sent from the ER. 6. Chronic atrial fibrillation on Cardizem 7. Dementia  Code Status:     Code Status Orders        Start     Ordered   10/24/15 0013  Do not attempt resuscitation (DNR)    Continuous    Question Answer Comment  In the event of cardiac or respiratory ARREST Do not call a "code blue"   In the event of cardiac or respiratory ARREST Do not perform Intubation, CPR, defibrillation or ACLS   In the event of cardiac or respiratory ARREST Use medication by any route, position, wound care, and other measures to relive pain and suffering. May use oxygen, suction and manual treatment of airway obstruction as needed for comfort.      10/24/15 0012    Code Status History    Date Active Date Inactive Code Status Order ID Comments User Context   02/27/2015  2:03 AM 03/01/2015  5:38 PM DNR [id number]  Crissie Figures, MD Inpatient    Advance Directive Documentation        Most Recent Value   Type of Advance Directive  Healthcare Power of Attorney   Pre-existing out of facility DNR order (yellow form or pink MOST form)     "MOST" Form in Place?       Family Communication: Daughter at the bedside.  Disposition Plan: Potentially home tomorrow  Antibiotics:  Rocephin  Time spent:  25 minutes  Shannon Williams  [facility]

## 2015-10-26 NOTE — Progress Notes (Signed)
Spoke with Dr.Wieting regarding pt's bladder scan result of post foley removal this am. Order received to bolus NS 500 ml over 4 hrs. Will continue to monitor.

## 2015-10-26 NOTE — Progress Notes (Signed)
Dr.Wieting here to see patient and discussing plan of care with family at the bedside.

## 2015-10-27 LAB — POTASSIUM: Potassium: 3.4 mmol/L — ABNORMAL LOW (ref 3.5–5.1)

## 2015-10-27 LAB — URINE CULTURE: Culture: NO GROWTH

## 2015-10-27 LAB — MAGNESIUM: Magnesium: 1.8 mg/dL (ref 1.7–2.4)

## 2015-10-27 MED ORDER — SODIUM CHLORIDE 0.9 % IV SOLN
INTRAVENOUS | Status: DC
  Administered 2015-10-27 (×2): via INTRAVENOUS

## 2015-10-27 MED ORDER — SODIUM CHLORIDE 0.9 % IV BOLUS (SEPSIS)
500.0000 mL | Freq: Once | INTRAVENOUS | Status: AC
Start: 2015-10-27 — End: 2015-10-27
  Administered 2015-10-27: 500 mL via INTRAVENOUS

## 2015-10-27 MED ORDER — DILTIAZEM HCL ER COATED BEADS 120 MG PO CP24
120.0000 mg | ORAL_CAPSULE | Freq: Every day | ORAL | Status: DC
  Administered 2015-10-27 – 2015-10-28 (×2): 120 mg via ORAL
  Filled 2015-10-27 (×2): qty 1

## 2015-10-27 MED ORDER — POTASSIUM CHLORIDE CRYS ER 20 MEQ PO TBCR
40.0000 meq | EXTENDED_RELEASE_TABLET | Freq: Once | ORAL | Status: AC
  Administered 2015-10-27: 40 meq via ORAL
  Filled 2015-10-27: qty 2

## 2015-10-27 MED ORDER — SODIUM CHLORIDE 0.9 % IV BOLUS (SEPSIS)
500.0000 mL | Freq: Once | INTRAVENOUS | Status: AC
  Administered 2015-10-27: 500 mL via INTRAVENOUS

## 2015-10-27 MED ORDER — BETHANECHOL CHLORIDE 25 MG PO TABS
25.0000 mg | ORAL_TABLET | Freq: Once | ORAL | Status: AC
  Administered 2015-10-27: 25 mg via ORAL
  Filled 2015-10-27: qty 1

## 2015-10-27 NOTE — Progress Notes (Signed)
Patient still has not voided, bladder scan results have been low. Bladder scan at 0400 was 320, Dr Sheryle Hailiamond, MD on-call notified. Bolus ordered, nursing continues to monitor.

## 2015-10-27 NOTE — Progress Notes (Signed)
MD gave order to discontinue telemetry 

## 2015-10-27 NOTE — Progress Notes (Signed)
Patient has not voided as of 1500.  Foley removed yesterday . Multiple bladder scans complete with the last being 437ml.  Dr. Hilton SinclairWeiting aware. Orders recd NOT to insert foley per dr. Hilton SinclairWeiting and family.  Family educated regarding need for foley.

## 2015-10-27 NOTE — Care Management Important Message (Signed)
Important Message  Patient Details  Name: Alphonzo SeveranceMargie F Dobratz MRN: [id number] Date of Birth: [birth year]   Medicare Important Message Given:  Yes    Olegario MessierKathy A Desirai Traxler 10/27/2015, 8:59 AM

## 2015-10-27 NOTE — Progress Notes (Signed)
Patient ID: Shannon Williams, female   DOB: 1922/02/13, 82 y.o.   MRN: [id number] [facility] PRO[facility]ESS [facility]  PCP: Rafael BihariWALKER III, [name], MD  HPI/Subjective: Patient had diarrhea yesterday with all the anticonstipation medications. She still has not urinated despite giving Urecholine earlier this morning and fluid bolus and IV fluid hydration. Patient stated that she did not want to go home until she is feeling better. She can't elaborate on how she feels.  Objective: Filed Vitals:   10/27/15 0821 10/27/15 1535  BP: 133/68 101/45  Pulse: 73 97  Temp: 97.7 F (36.5 C) 98 F (36.7 C)  Resp: 18 18    Filed Weights   10/23/15 1822 10/24/15 0100  Weight: 68.856 kg (151 lb 12.8 oz) 61.825 kg (136 lb 4.8 oz)    ROS: Review of Systems  Constitutional: Negative for fever.  Eyes: Negative for blurred vision.  Respiratory: Negative for cough and shortness of breath.   Cardiovascular: Negative for chest pain.  Gastrointestinal: Positive for abdominal pain and diarrhea. Negative for nausea, vomiting and constipation.  Musculoskeletal: Negative for joint pain.  Neurological: Negative for dizziness and headaches.   Exam: Physical Exam  HENT:  Nose: No mucosal edema.  Mouth/Throat: No oropharyngeal exudate or posterior oropharyngeal edema.  Eyes: Conjunctivae, EOM and lids are normal. Pupils are equal, round, and reactive to light.  Neck: No JVD present. Carotid bruit is not present. No edema present. No thyroid mass and no thyromegaly present.  Cardiovascular: Regular rhythm, S1 normal and S2 normal.  Exam reveals no gallop.   No murmur heard. Pulses:      Dorsalis pedis pulses are 2+ on the right side, and 2+ on the left side.  Respiratory: No respiratory distress. She has decreased breath sounds in the right lower field and the left lower field. She has no wheezes. She has no rhonchi. She has no rales.  GI: Soft. Bowel sounds are normal. There is tenderness in the suprapubic  area.  Musculoskeletal:       Right ankle: She exhibits swelling.       Left ankle: She exhibits swelling.  Lymphadenopathy:    She has no cervical adenopathy.  Neurological: She is alert. No cranial nerve deficit.  Skin: Skin is warm. No rash noted. Nails show no clubbing.  Psychiatric: She has a normal mood and affect.    Data Reviewed: Basic Metabolic Panel:  Recent Labs Lab 10/23/15 1851 10/24/15 0513 10/25/15 0332 10/27/15 0652  NA 135 137 140  --   K 3.3* 3.1* 3.1* 3.4*  CL 99* 104 106  --   CO2 28 26 26   --   GLUCOSE 120* 101* 94  --   BUN 17 16 11   --   CREATININE 1.19* 0.91 0.70  --   CALCIUM 9.0 8.6* 8.8*  --   MG  --   --   --  1.8   Liver Function Tests:  Recent Labs Lab 10/23/15 1851  AST 16  ALT 12*  ALKPHOS 57  BILITOT 0.7  PROT 7.1  ALBUMIN 3.6   CBC:  Recent Labs Lab 10/23/15 1851 10/24/15 0513  WBC 12.0* 10.5  NEUTROABS 8.6*  --   HGB 13.2 11.1*  HCT 39.4 32.8*  MCV 92.3 90.1  PLT 234 204   Cardiac Enzymes:  Recent Labs Lab 10/23/15 1851  TROPONINI <0.03    Scheduled Meds: . aspirin EC  325 mg Oral BID  . cefTRIAXone (ROCEPHIN)  IV  1 g  Intravenous q morning - 10a  . citalopram  10 mg Oral QHS  . diltiazem  120 mg Oral Daily  . enoxaparin (LOVENOX) injection  40 mg Subcutaneous Q24H  . magnesium oxide  400 mg Oral Daily  . sodium chloride  3 mL Intravenous Q12H    Assessment/Plan:  1. Acute renal failure, relative hypotension and syncope. Improved with hydration 2. Severe constipation- no diarrhea. Anticonstipation meds on hold right now. 3. Urinary retention- nurse to bladder scan again now. If a high number I will have to put in a Foley catheter. Family was resistant about this. When I may have no choice. I would rather not evaluate the reasons for her urinary retention because of her age and likelihood of not doing anything regardless of what we find. 4. Hypokalemia replace potassium 5. Urine analysis was positive  urine culture was negative finished 3 days of IV Rocephin and will complete today. 6. Chronic atrial fibrillation on Cardizem. I decreased the dose with relative hypotension 7. Dementia  Code Status:     Code Status Orders        Start     Ordered   10/24/15 0013  Do not attempt resuscitation (DNR)   Continuous    Question Answer Comment  In the event of cardiac or respiratory ARREST Do not call a "code blue"   In the event of cardiac or respiratory ARREST Do not perform Intubation, CPR, defibrillation or ACLS   In the event of cardiac or respiratory ARREST Use medication by any route, position, wound care, and other measures to relive pain and suffering. May use oxygen, suction and manual treatment of airway obstruction as needed for comfort.      10/24/15 0012    Code Status History    Date Active Date Inactive Code Status Order ID Comments User Context   02/27/2015  2:03 AM 03/01/2015  5:38 PM DNR [id number]  Crissie Figures, MD Inpatient    Advance Directive Documentation        Most Recent Value   Type of Advance Directive  Healthcare Power of Attorney   Pre-existing out of facility DNR order (yellow form or pink MOST form)     "MOST" Form in Place?       Family Communication: Daughter at the bedside.  Disposition Plan: Evaluate on a daily basis to see whether or not we can go home.  Time spent:  25 minutes  Alford Highland  [facility]

## 2015-10-28 MED ORDER — BISACODYL 10 MG RE SUPP: 10.0000 mg | Freq: Every day | RECTAL | Status: DC | PRN

## 2015-10-28 MED ORDER — MAGNESIUM OXIDE 400 (241.3 MG) MG PO TABS: 400.0000 mg | ORAL_TABLET | Freq: Every day | ORAL | Status: AC

## 2015-10-28 MED ORDER — BISACODYL 10 MG RE SUPP: 10.0000 mg | Freq: Every day | RECTAL | Status: AC | PRN

## 2015-10-28 MED ORDER — DILTIAZEM HCL ER COATED BEADS 120 MG PO CP24: 120.0000 mg | ORAL_CAPSULE | Freq: Every day | ORAL | Status: DC

## 2015-10-28 MED ORDER — ASPIRIN EC 325 MG PO TBEC: 325.0000 mg | DELAYED_RELEASE_TABLET | Freq: Every day | ORAL | Status: AC

## 2015-10-28 MED ORDER — POLYETHYLENE GLYCOL 3350 17 G PO PACK: 17.0000 g | PACK | Freq: Every day | ORAL | Status: AC | PRN

## 2015-10-28 MED ORDER — DILTIAZEM HCL ER COATED BEADS 120 MG PO CP24: 120.0000 mg | ORAL_CAPSULE | Freq: Every day | ORAL | Status: AC

## 2015-10-28 MED ORDER — MAGNESIUM OXIDE 400 (241.3 MG) MG PO TABS: 400.0000 mg | ORAL_TABLET | Freq: Every day | ORAL | Status: DC

## 2015-10-28 MED ORDER — POLYETHYLENE GLYCOL 3350 17 G PO PACK: 17.0000 g | PACK | Freq: Every day | ORAL | Status: DC | PRN

## 2015-10-28 NOTE — Progress Notes (Signed)
Daughter took patient's DNR order with her when she left; without leaving it for EMS.  Called and advised family.

## 2015-10-28 NOTE — Care Management Note (Signed)
Case Management Note  Patient Details  Name: Shannon Williams MRN: [id number] Date of Birth: September 07, 1922  Subjective/Objective:      Referral faxed to [facility] requesting home health RN and nurse aid. [facility] should be in touch with Shannon Williams to schedule an appointment within 48 hours after discharge.               Action/Plan:   Expected Discharge Date:                  Expected Discharge Plan:     In-House Referral:     Discharge planning Services     Post Acute Care Choice:    Choice offered to:  Patient, Adult Children Elita Quick([name] and Samara DeistKathryn)  DME Arranged:    DME Agency:     HH Arranged:    HH Agency:     Status of Service:  In process, will continue to follow  Medicare Important Message Given:  Yes Date Medicare IM Given:    Medicare IM give by:    Date Additional Medicare IM Given:    Additional Medicare Important Message give by:     If discussed at Long Length of Stay Meetings, dates discussed:    Additional Comments:  Maciah Feeback A, RN 10/28/2015, 9:26 AM

## 2015-10-28 NOTE — Progress Notes (Signed)
Patient being discharged to home with [facility]. Lives with daughter. Discharge & Rx instructions given. Foley and catheter education to daughters. IV removed and belongings packed. Patients VSS upon discharge. EMS called.

## 2015-10-28 NOTE — Discharge Summary (Signed)
[facility] - [place] [facility]   PATIENT NAME: Shannon Williams    MR#:  [id number]  DATE OF BIRTH:  04-Sep-1922  DATE OF ADMISSION:  10/23/2015 ADMITTING PHYSICIAN: Oralia Manis, MD  DATE OF DISCHARGE: 10/28/2015  PRIMARY CARE PHYSICIAN: Marletta Lor   ADMISSION DIAGNOSIS:  Syncope and collapse [R55] Constipation [K59.00] Hypotension, unspecified hypotension type [I95.9]  DISCHARGE DIAGNOSIS:  Principal Problem:   AKI (acute kidney injury) (HCC) Active Problems:   Chronic a-fib (HCC)   HTN (hypertension)   Dementia   Syncope   Dehydration   Constipation   SECONDARY DIAGNOSIS:   Past Medical History  Diagnosis Date  . Atrial fibrillation (HCC)   . Peripheral edema   . Hypertension   . Dementia   . Pneumonia     HOSPITAL COURSE:   1. Acute renal failure, relative hypotension and syncope. Improved with hydration. 2. Severe constipation this improved and patient actually had diarrhea. When necessary medications at home. Patient had a be manually disimpacted. 3. Urinary retention- I had to place a Foley. Foley catheter care at home with home health to change every 3 weeks. Nursing staff did speak with sisters about draining Foley. 4. Hypokalemia and hypomagnesemia replaced potassium orally and magnesium upon discharge home 5. Urinalysis was positive but urine culture was negative. Patient received 3 days of Rocephin 6. Chronic atrial fibrillation- decreased dose of Cardizem. Aspirin for anticoagulation 7 dementia, with poor appetite  DISCHARGE CONDITIONS:   Fair  CONSULTS OBTAINED:  Treatment Team:  Laurier Nancy, MD Alwyn Pea, MD  DRUG ALLERGIES:   Allergies  Allergen Reactions  . Sulfa Antibiotics Itching  . Codeine Anxiety    DISCHARGE MEDICATIONS:   Current Discharge Medication List    START taking these medications   Details  bisacodyl (DULCOLAX) 10 MG suppository Place 1 suppository (10 mg total) rectally  daily as needed for moderate constipation. Qty: 12 suppository, Refills: 0    diltiazem (CARDIZEM CD) 120 MG 24 hr capsule Take 1 capsule (120 mg total) by mouth daily. Qty: 30 capsule, Refills: 0    magnesium oxide (MAG-OX) 400 (241.3 Mg) MG tablet Take 1 tablet (400 mg total) by mouth daily. Qty: 30 tablet, Refills: 0    polyethylene glycol (MIRALAX / GLYCOLAX) packet Take 17 g by mouth daily as needed for moderate constipation. Qty: 14 each, Refills: 0      CONTINUE these medications which have CHANGED   Details  aspirin EC 325 MG tablet Take 1 tablet (325 mg total) by mouth daily. Qty: 30 tablet, Refills: 0      CONTINUE these medications which have NOT CHANGED   Details  acetaminophen (TYLENOL) 500 MG tablet Take 1,000 mg by mouth every 6 (six) hours as needed for mild pain.     citalopram (CELEXA) 10 MG tablet Take 10 mg by mouth at bedtime.     docusate sodium (COLACE) 100 MG capsule Take 100 mg by mouth 2 (two) times daily as needed for mild constipation.       STOP taking these medications     diltiazem (DILACOR XR) 240 MG 24 hr capsule      furosemide (LASIX) 20 MG tablet      losartan (COZAAR) 100 MG tablet          DISCHARGE INSTRUCTIONS:   Follow-up with Marletta Lor 1 week If patient decompensates may end up being a candidate for hospice again. Continue home health  If you experience worsening of  your admission symptoms, develop shortness of breath, life threatening emergency, suicidal or homicidal thoughts you must seek medical attention immediately by calling 911 or calling your MD immediately  if symptoms less severe.  You Must read complete instructions/literature along with all the possible adverse reactions/side effects for all the Medicines you take and that have been prescribed to you. Take any new Medicines after you have completely understood and accept all the possible adverse reactions/side effects.   Please note  You were cared for by a  hospitalist during your hospital stay. If you have any questions about your discharge medications or the care you received while you were in the hospital after you are discharged, you can call the unit and asked to speak with the hospitalist on call if the hospitalist that took care of you is not available. Once you are discharged, your primary care physician will handle any further medical issues. Please note that NO REFILLS for any discharge medications will be authorized once you are discharged, as it is imperative that you return to your primary care physician (or establish a relationship with a primary care physician if you do not have one) for your aftercare needs so that they can reassess your need for medications and monitor your lab values.    Today   CHIEF COMPLAINT:   Chief Complaint  Patient presents with  . Loss of Consciousness    HISTORY OF PRESENT ILLNESS:  Shannon Williams  is a 82 y.o. female presented with syncope and found to be in acute renal failure and relative hypotension.   VITAL SIGNS:  Blood pressure 144/58, pulse 84, temperature 97.7 F (36.5 C), temperature source Oral, resp. rate 16, height 4\' 11"  (1.499 m), weight 61.825 kg (136 lb 4.8 oz), SpO2 99 %.    PHYSICAL EXAMINATION:  GENERAL:  82 y.o.-year-old patient lying in the bed with no acute distress.  EYES: Pupils equal, round, reactive to light and accommodation. No scleral icterus. Extraocular muscles intact.  HEENT: Head atraumatic, normocephalic. Oropharynx and nasopharynx clear.  NECK:  Supple, no jugular venous distention. No thyroid enlargement, no tenderness.  LUNGS: Normal breath sounds bilaterally, no wheezing, rales,rhonchi or crepitation. No use of accessory muscles of respiration.  CARDIOVASCULAR: S1, S2 normal. There is 6 systolic murmur, no rubs, or gallops.  ABDOMEN: Soft, non-tender, non-distended. Bowel sounds present. No organomegaly or mass.  EXTREMITIES: Trace edema, no cyanosis, or  clubbing.  NEUROLOGIC: Cranial nerves II through XII are intact. PSYCHIATRIC: The patient is alert.  SKIN: Bruising lower extremities  DATA REVIEW:   CBC  Recent Labs Lab 10/24/15 0513  WBC 10.5  HGB 11.1*  HCT 32.8*  PLT 204    Chemistries   Recent Labs Lab 10/23/15 1851  10/25/15 0332 10/27/15 0652  NA 135  < > 140  --   K 3.3*  < > 3.1* 3.4*  CL 99*  < > 106  --   CO2 28  < > 26  --   GLUCOSE 120*  < > 94  --   BUN 17  < > 11  --   CREATININE 1.19*  < > 0.70  --   CALCIUM 9.0  < > 8.8*  --   MG  --   --   --  1.8  AST 16  --   --   --   ALT 12*  --   --   --   ALKPHOS 57  --   --   --  BILITOT 0.7  --   --   --   < > = values in this interval not displayed.  Cardiac Enzymes  Recent Labs Lab 10/23/15 1851  TROPONINI <0.03    Microbiology Results  Results for orders placed or performed during the hospital encounter of 10/23/15  Urine culture     Status: None   Collection Time: 10/25/15  2:38 PM  Result Value Ref Range Status   Specimen Description URINE, CATHETERIZED  Final   Special Requests NONE  Final   Culture NO GROWTH 2 DAYS  Final   Report Status 10/27/2015 FINAL  Final    Management plans discussed with the patient, family and they are in agreement.  CODE STATUS:     Code Status Orders        Start     Ordered   10/24/15 0013  Do not attempt resuscitation (DNR)   Continuous    Question Answer Comment  In the event of cardiac or respiratory ARREST Do not call a "code blue"   In the event of cardiac or respiratory ARREST Do not perform Intubation, CPR, defibrillation or ACLS   In the event of cardiac or respiratory ARREST Use medication by any route, position, wound care, and other measures to relive pain and suffering. May use oxygen, suction and manual treatment of airway obstruction as needed for comfort.      10/24/15 0012    Code Status History    Date Active Date Inactive Code Status Order ID Comments User Context    02/27/2015  2:03 AM 03/01/2015  5:38 PM DNR [id number]  Crissie FiguresEdavally N Reddy, MD Inpatient    Advance Directive Documentation        Most Recent Value   Type of Advance Directive  Healthcare Power of Attorney   Pre-existing out of facility DNR order (yellow form or pink MOST form)     "MOST" Form in Place?        TOTAL TIME TAKING CARE OF THIS PATIENT: 35 minutes.    Alford HighlandWIETING, Aryelle Figg M.D on 10/28/2015 at 9:14 AM  Between 7am to 6pm - Pager - [phone]  After 6pm go to [email or web address] - password [email or web address] [email or web address]  Office  [phone]  CC: Primary care physician; Marletta LorJulie Barr

## 2016-09-30 IMAGING — CT CT HEAD W/O CM
1 of 2 series · 13 of 30 positions shown, 17 images · non-contrast
Comparison: CT of the head 9719

CLINICAL DATA: Confusion beginning at 7 a.m. today. History atrial
fibrillation, hypertension.

EXAM:
CT HEAD WITHOUT CONTRAST
TECHNIQUE: Contiguous axial images were obtained from the base of the skull
through the vertex without intravenous contrast.

[Series 2: head wo · axial · 0.43mm/px · z∈[-171,-46]mm · 13 of 31 slices shown, 17 images]
[im 3/31  brain]
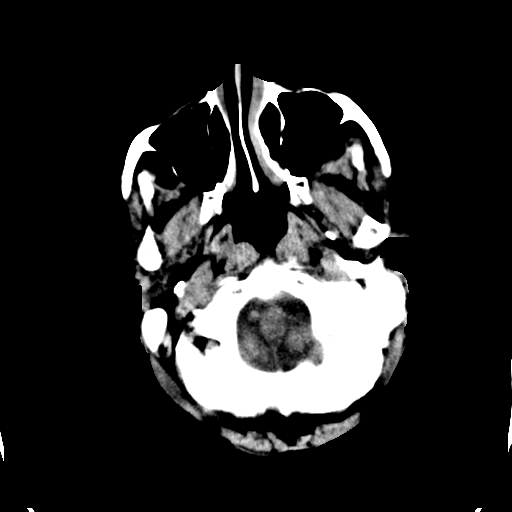
[im 3/31  bone]
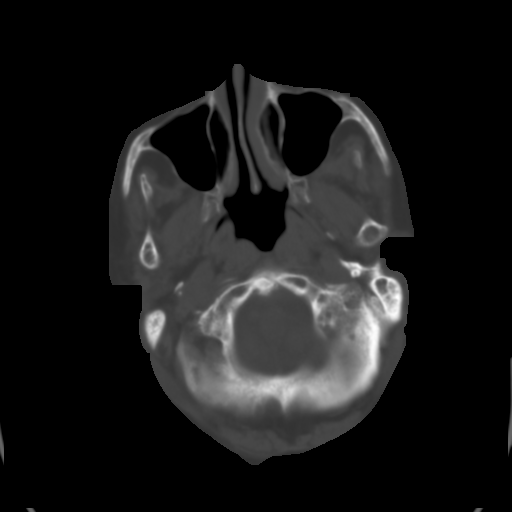
[im 5/31  brain]
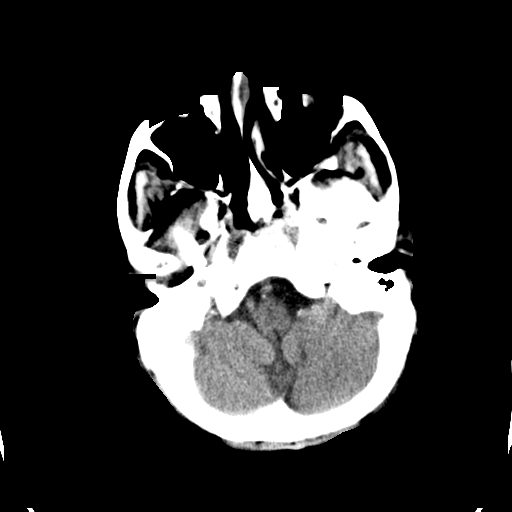
[im 7/31  brain]
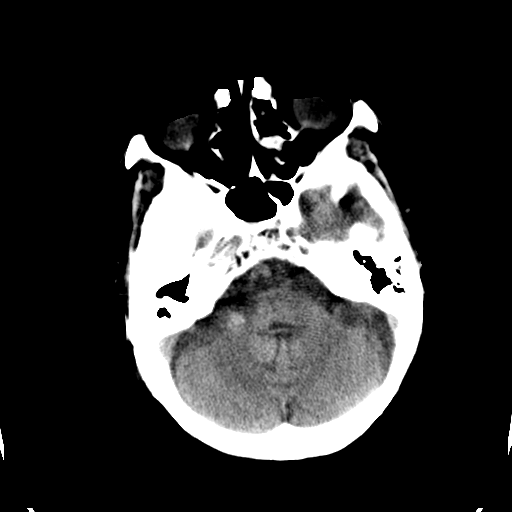
[im 9/31  brain]
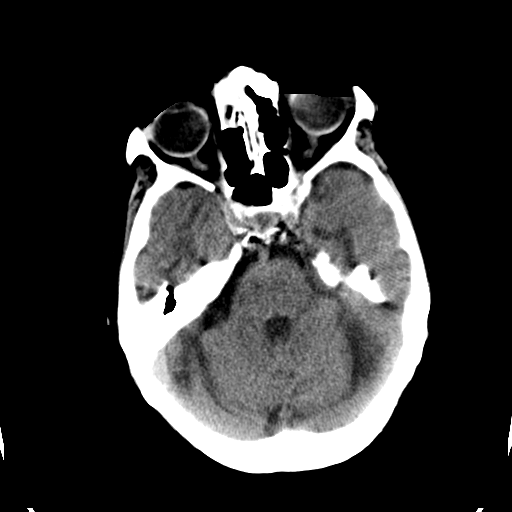
[im 11/31  brain]
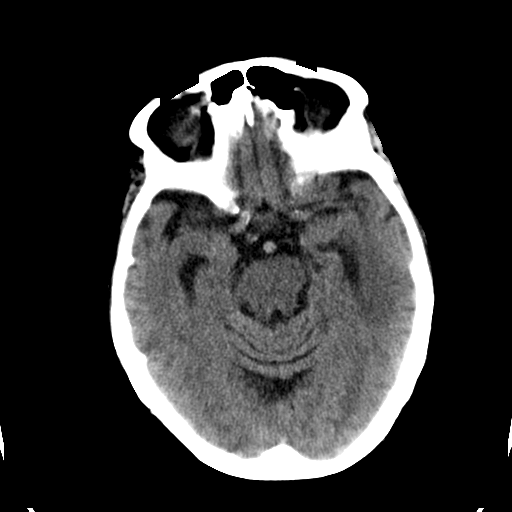
[im 11/31  bone]
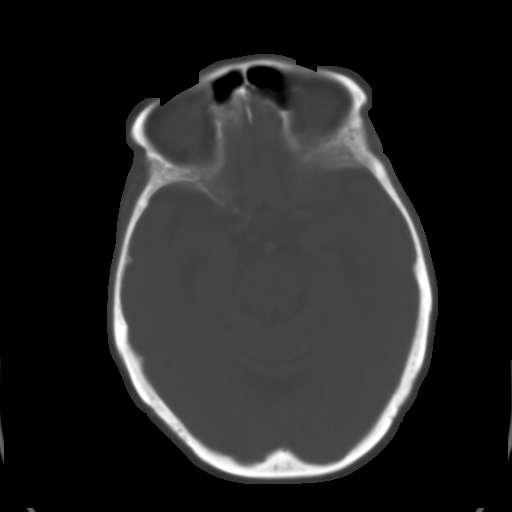
[im 13/31  brain]
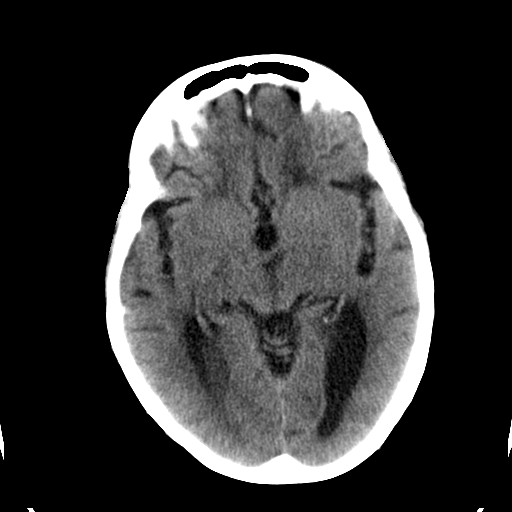
[im 16/31  brain]
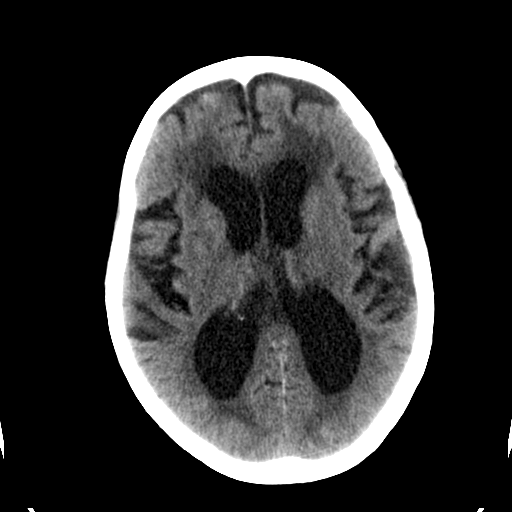
[im 18/31  brain]
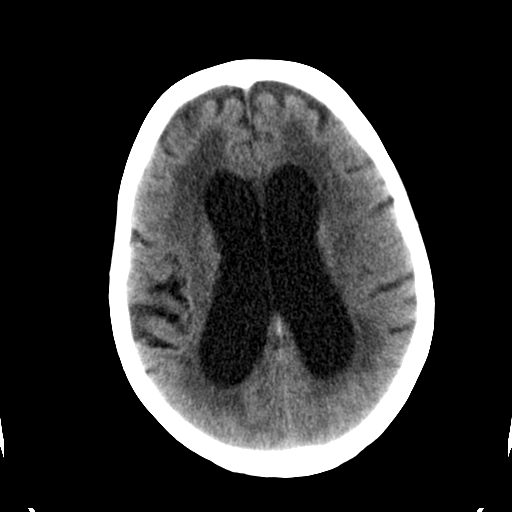
[im 20/31  brain]
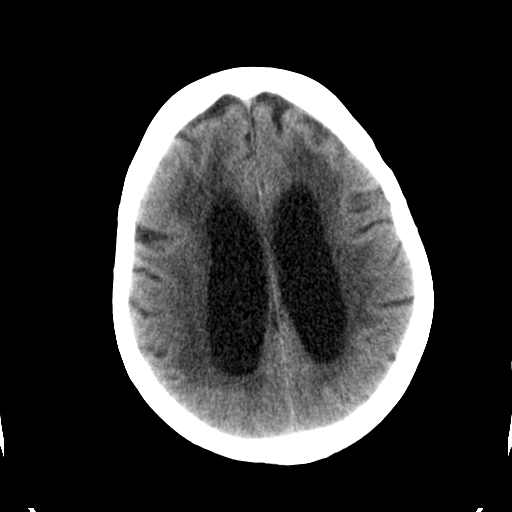
[im 20/31  bone]
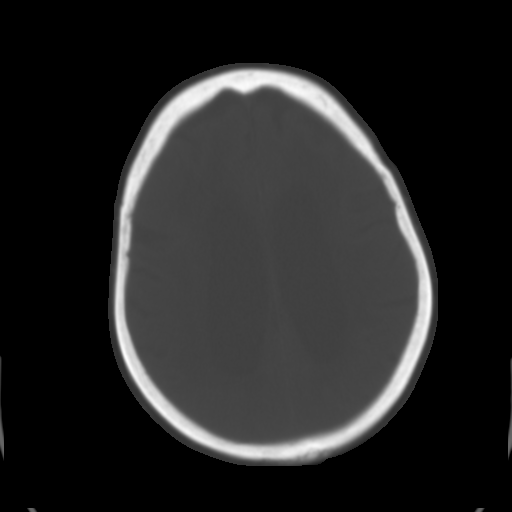
[im 22/31  brain]
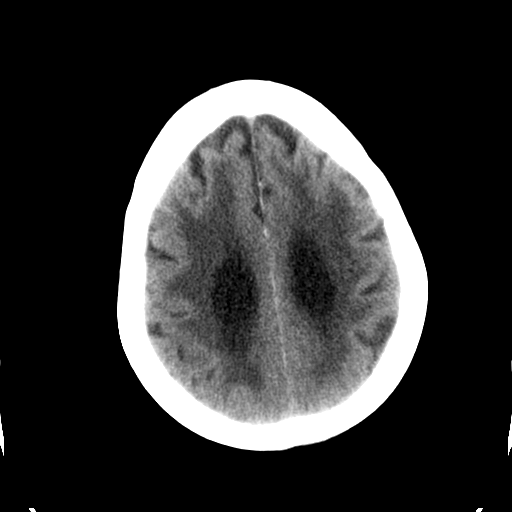
[im 24/31  brain]
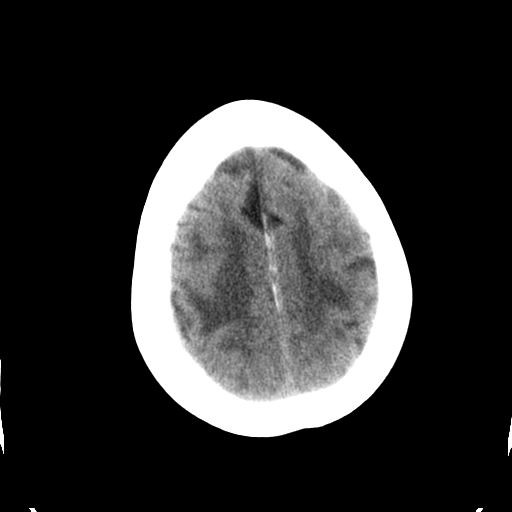
[im 26/31  brain]
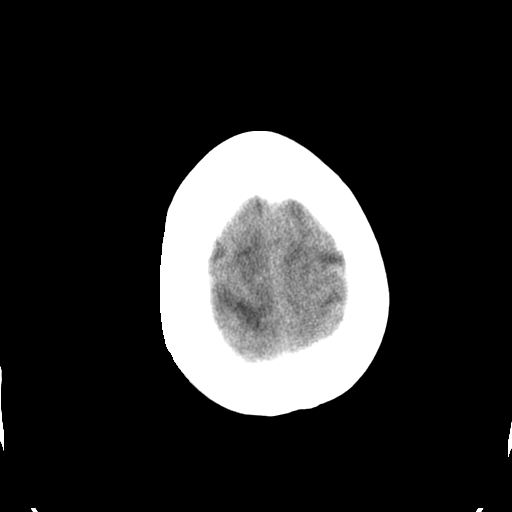
[im 28/31  brain]
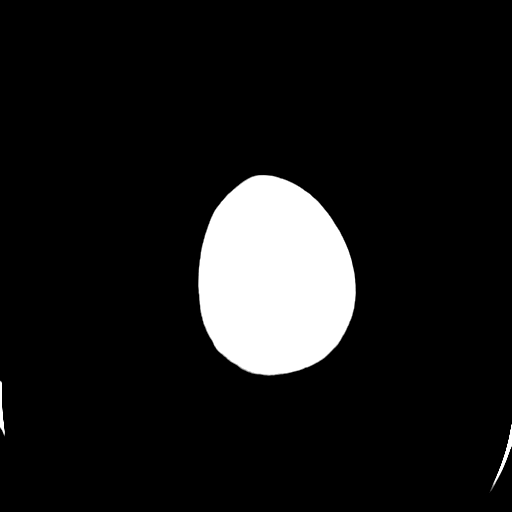
[im 28/31  bone]
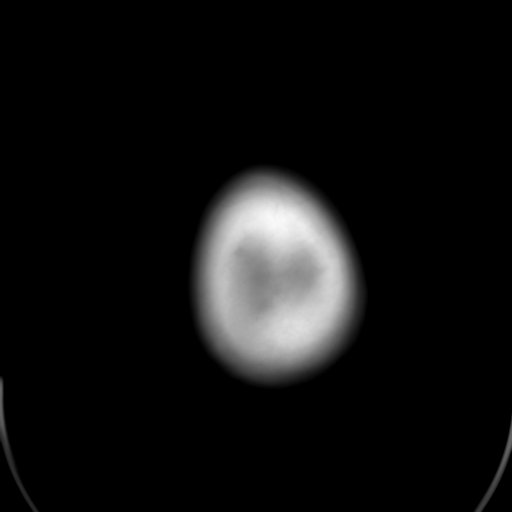

[13 of 30 positions shown; findings below may reference images not displayed]

FINDINGS: Moderate to severe ventriculomegaly, similar to from prior
examination with disproportionate sulcal effacement at the cerebral
convexities, unchanged. Confluent supratentorial white matter
hypodensities are similar. No intraparenchymal hemorrhage, mass
effect, midline shift nor acute large vascular territory infarcts.

No abnormal extra-axial fluid collections. Mild calcific
atherosclerosis the carotid siphons.

No skull fracture. The included ocular globes and orbital contents
are non-suspicious. Bilateral ocular lens implants. Mild paranasal
sinus mucosal thickening with the RIGHT sphenoid sinus air-fluid
level. The mastoid air cells are well aerated radiated. Mild RIGHT
mastoid air cell coalescence suggest prior mastoiditis without acute
component.
IMPRESSION: No acute intracranial process.

Findings of normal pressure hydrocephalus, progressed from prior
imaging. Severe white matter changes likely represent chronic small
vessel ischemic disease.

Mild acute paranasal sinusitis.

By: Llama Timko

## 2017-05-28 IMAGING — CR DG ABDOMEN 2V
1 series · 3 of 3 positions shown · non-contrast
Comparison: CT dated 10/29/2011

CLINICAL DATA: [AGE] female with constipation.

EXAM:
ABDOMEN - 2 VIEW

[Series 1: dg abd 2 views · 0.14mm/px · 3 of 3 slices shown]
[im 1/3]
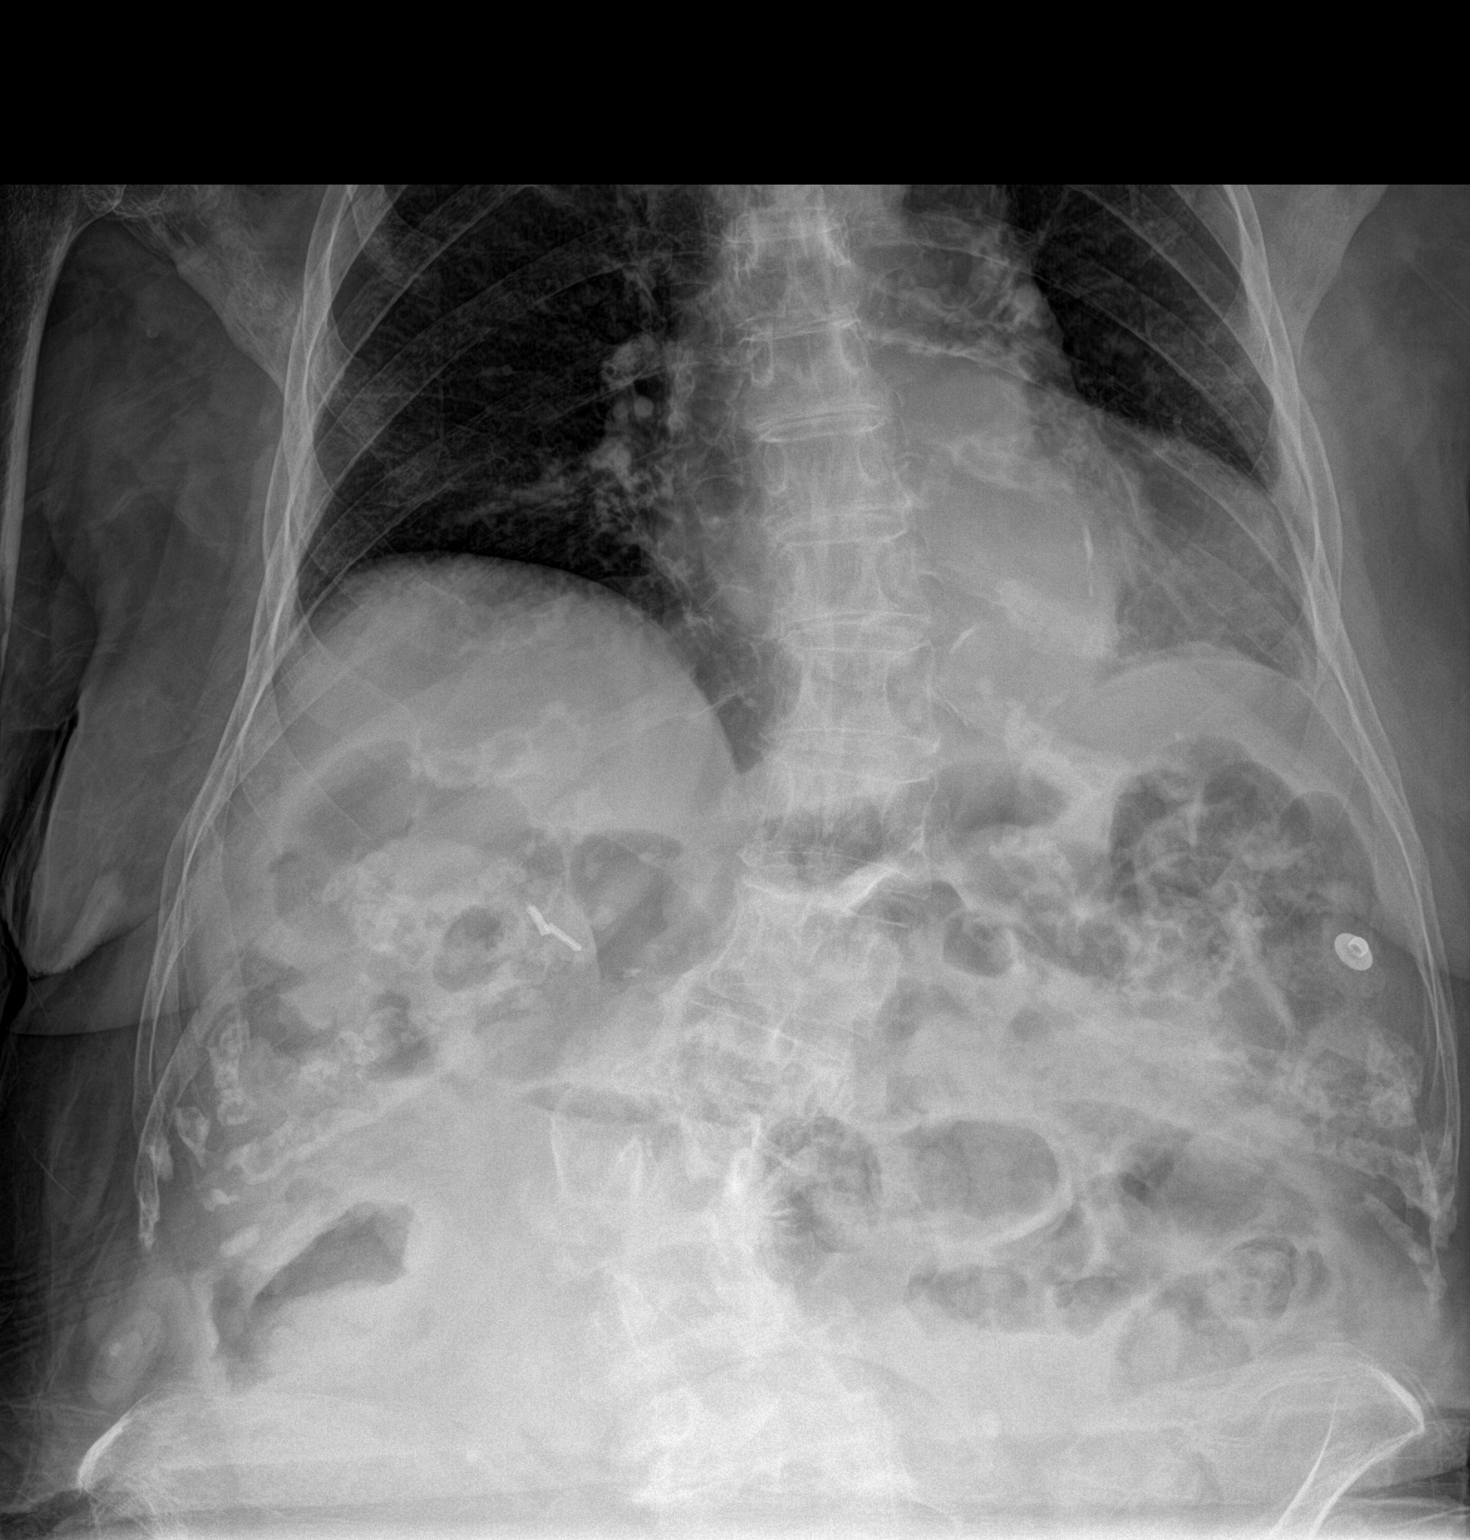
[im 2/3]
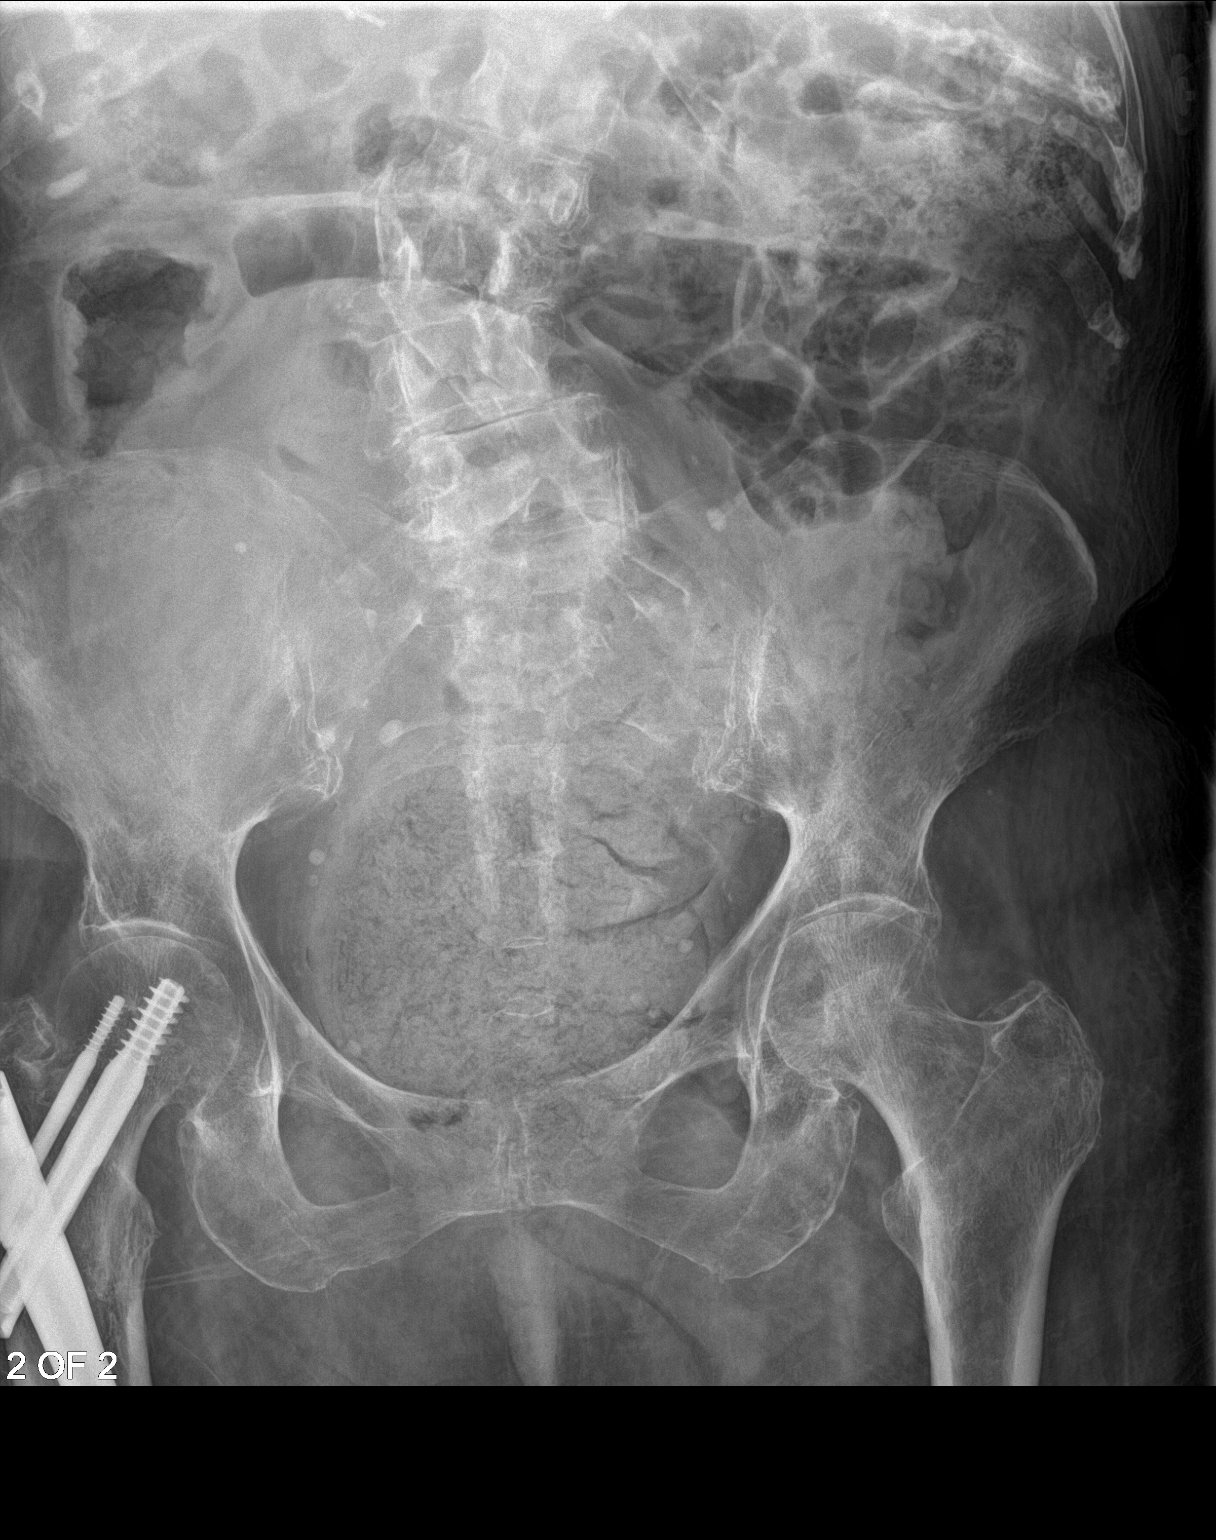
[im 3/3]
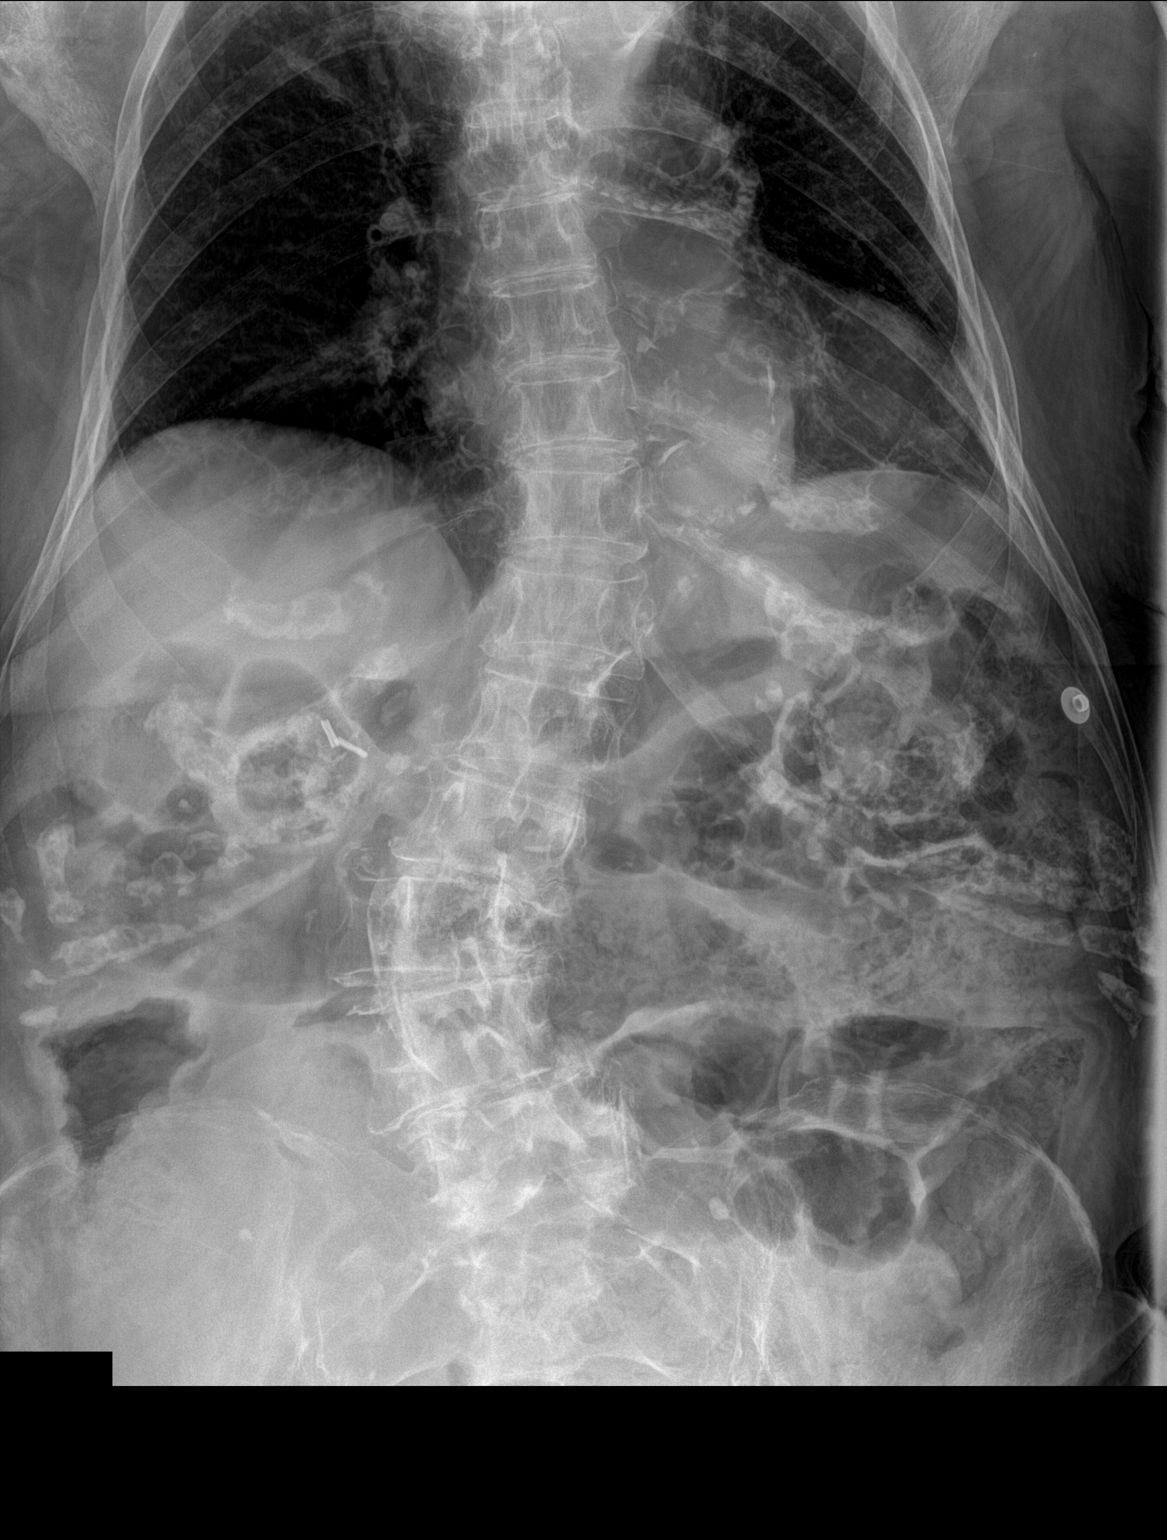

[3 of 3 positions shown; findings below may reference images not displayed]

FINDINGS: There is moderate stool throughout the colon. No evidence of bowel
obstruction. Right upper quadrant cholecystectomy clips. There is
osteopenia with scoliosis and degenerative changes of the spine.
IMPRESSION: Constipation.  No bowel obstruction.

## 2017-10-09 ENCOUNTER — Other Ambulatory Visit: Payer: Self-pay

## 2017-10-09 ENCOUNTER — Inpatient Hospital Stay
Admission: EM | Admit: 2017-10-09 | Discharge: 2017-10-11 | DRG: 690 | Disposition: A | Discharge status: Hospice | Payer: Medicare PPO | Attending: Internal Medicine | Admitting: Internal Medicine

## 2017-10-09 ENCOUNTER — Encounter: Payer: Self-pay | Admitting: Emergency Medicine

## 2017-10-09 ENCOUNTER — Emergency Department: Payer: Medicare PPO

## 2017-10-09 DIAGNOSIS — I10 Essential (primary) hypertension: Secondary | ICD-10-CM | POA: Diagnosis present

## 2017-10-09 DIAGNOSIS — Z7982 Long term (current) use of aspirin: Secondary | ICD-10-CM | POA: Diagnosis not present

## 2017-10-09 DIAGNOSIS — N390 Urinary tract infection, site not specified: Secondary | ICD-10-CM | POA: Diagnosis present

## 2017-10-09 DIAGNOSIS — Z881 Allergy status to other antibiotic agents status: Secondary | ICD-10-CM | POA: Diagnosis not present

## 2017-10-09 DIAGNOSIS — E860 Dehydration: Secondary | ICD-10-CM | POA: Diagnosis present

## 2017-10-09 DIAGNOSIS — Z79899 Other long term (current) drug therapy: Secondary | ICD-10-CM

## 2017-10-09 DIAGNOSIS — Z66 Do not resuscitate: Secondary | ICD-10-CM | POA: Diagnosis present

## 2017-10-09 DIAGNOSIS — Z90710 Acquired absence of both cervix and uterus: Secondary | ICD-10-CM | POA: Diagnosis not present

## 2017-10-09 DIAGNOSIS — Z9049 Acquired absence of other specified parts of digestive tract: Secondary | ICD-10-CM

## 2017-10-09 DIAGNOSIS — I4891 Unspecified atrial fibrillation: Secondary | ICD-10-CM

## 2017-10-09 DIAGNOSIS — I480 Paroxysmal atrial fibrillation: Secondary | ICD-10-CM | POA: Diagnosis present

## 2017-10-09 DIAGNOSIS — Z87891 Personal history of nicotine dependence: Secondary | ICD-10-CM

## 2017-10-09 DIAGNOSIS — R627 Adult failure to thrive: Secondary | ICD-10-CM | POA: Diagnosis present

## 2017-10-09 DIAGNOSIS — F0390 Unspecified dementia without behavioral disturbance: Secondary | ICD-10-CM | POA: Diagnosis present

## 2017-10-09 DIAGNOSIS — Z885 Allergy status to narcotic agent status: Secondary | ICD-10-CM | POA: Diagnosis not present

## 2017-10-09 LAB — URINALYSIS, COMPLETE (UACMP) WITH MICROSCOPIC
Bacteria, UA: NONE SEEN
Bilirubin Urine: NEGATIVE
Glucose, UA: NEGATIVE mg/dL
Ketones, ur: 20 mg/dL — AB
Nitrite: NEGATIVE
Protein, ur: 30 mg/dL — AB
SPECIFIC GRAVITY, URINE: 1.013 (ref 1.005–1.030)
pH: 6 (ref 5.0–8.0)

## 2017-10-09 LAB — CBC
HCT: 42.6 % (ref 35.0–47.0)
Hemoglobin: 13.9 g/dL (ref 12.0–16.0)
MCH: 29.3 pg (ref 26.0–34.0)
MCHC: 32.5 g/dL (ref 32.0–36.0)
MCV: 90.1 fL (ref 80.0–100.0)
PLATELETS: 232 10*3/uL (ref 150–440)
RBC: 4.73 MIL/uL (ref 3.80–5.20)
RDW: 14.7 % — ABNORMAL HIGH (ref 11.5–14.5)
WBC: 14.2 10*3/uL — ABNORMAL HIGH (ref 3.6–11.0)

## 2017-10-09 LAB — BASIC METABOLIC PANEL
Anion gap: 13 (ref 5–15)
BUN: 14 mg/dL (ref 6–20)
CALCIUM: 9 mg/dL (ref 8.9–10.3)
CO2: 22 mmol/L (ref 22–32)
Chloride: 102 mmol/L (ref 101–111)
Creatinine, Ser: 0.67 mg/dL (ref 0.44–1.00)
Glucose, Bld: 80 mg/dL (ref 65–99)
Potassium: 3.8 mmol/L (ref 3.5–5.1)
Sodium: 137 mmol/L (ref 135–145)

## 2017-10-09 LAB — INFLUENZA PANEL BY PCR (TYPE A & B)
INFLAPCR: NEGATIVE
INFLBPCR: NEGATIVE

## 2017-10-09 LAB — TROPONIN I

## 2017-10-09 MED ORDER — POLYETHYLENE GLYCOL 3350 17 G PO PACK
17.0000 g | PACK | Freq: Every day | ORAL | Status: DC | PRN
  Filled 2017-10-09: qty 1

## 2017-10-09 MED ORDER — DOCUSATE SODIUM 100 MG PO CAPS: 100.0000 mg | ORAL_CAPSULE | Freq: Two times a day (BID) | ORAL | Status: DC | PRN

## 2017-10-09 MED ORDER — ACETAMINOPHEN 500 MG PO TABS: 1000.0000 mg | ORAL_TABLET | Freq: Four times a day (QID) | ORAL | Status: DC | PRN

## 2017-10-09 MED ORDER — MAGNESIUM OXIDE 400 (241.3 MG) MG PO TABS
400.0000 mg | ORAL_TABLET | Freq: Every day | ORAL | Status: DC
  Administered 2017-10-09 – 2017-10-11 (×3): 400 mg via ORAL
  Filled 2017-10-09 (×3): qty 1

## 2017-10-09 MED ORDER — SODIUM CHLORIDE 0.9 % IV BOLUS (SEPSIS)
500.0000 mL | Freq: Once | INTRAVENOUS | Status: AC
  Administered 2017-10-09: 500 mL via INTRAVENOUS

## 2017-10-09 MED ORDER — CEFTRIAXONE SODIUM IN DEXTROSE 20 MG/ML IV SOLN
1.0000 g | INTRAVENOUS | Status: DC
  Filled 2017-10-09: qty 50

## 2017-10-09 MED ORDER — ACETAMINOPHEN 325 MG PO TABS
650.0000 mg | ORAL_TABLET | Freq: Four times a day (QID) | ORAL | Status: DC | PRN
  Administered 2017-10-09: 650 mg via ORAL
  Filled 2017-10-09: qty 2

## 2017-10-09 MED ORDER — CEFTRIAXONE SODIUM IN DEXTROSE 20 MG/ML IV SOLN
1.0000 g | Freq: Once | INTRAVENOUS | Status: AC
  Administered 2017-10-09: 1 g via INTRAVENOUS
  Filled 2017-10-09: qty 50

## 2017-10-09 MED ORDER — BISACODYL 10 MG RE SUPP
10.0000 mg | Freq: Every day | RECTAL | Status: DC | PRN
Start: 2017-10-09 — End: 2017-10-11

## 2017-10-09 MED ORDER — ONDANSETRON HCL 4 MG PO TABS: 4.0000 mg | ORAL_TABLET | Freq: Four times a day (QID) | ORAL | Status: DC | PRN

## 2017-10-09 MED ORDER — BISACODYL 5 MG PO TBEC: 5.0000 mg | DELAYED_RELEASE_TABLET | Freq: Every day | ORAL | Status: DC | PRN

## 2017-10-09 MED ORDER — SODIUM CHLORIDE 0.9 % IV SOLN
INTRAVENOUS | Status: DC
  Administered 2017-10-09 – 2017-10-10 (×2): via INTRAVENOUS

## 2017-10-09 MED ORDER — DILTIAZEM HCL ER COATED BEADS 120 MG PO CP24
120.0000 mg | ORAL_CAPSULE | Freq: Every day | ORAL | Status: DC
  Administered 2017-10-09 – 2017-10-11 (×2): 120 mg via ORAL
  Filled 2017-10-09 (×2): qty 1

## 2017-10-09 MED ORDER — ASPIRIN EC 325 MG PO TBEC
325.0000 mg | DELAYED_RELEASE_TABLET | Freq: Every day | ORAL | Status: DC
  Administered 2017-10-09 – 2017-10-11 (×3): 325 mg via ORAL
  Filled 2017-10-09 (×3): qty 1

## 2017-10-09 MED ORDER — POLYETHYLENE GLYCOL 3350 17 G PO PACK: 17.0000 g | PACK | Freq: Every day | ORAL | Status: DC | PRN

## 2017-10-09 MED ORDER — IBUPROFEN 400 MG PO TABS: 400.0000 mg | ORAL_TABLET | Freq: Four times a day (QID) | ORAL | Status: DC | PRN

## 2017-10-09 MED ORDER — DILTIAZEM HCL 25 MG/5ML IV SOLN
10.0000 mg | Freq: Once | INTRAVENOUS | Status: AC
  Administered 2017-10-09: 10 mg via INTRAVENOUS
  Filled 2017-10-09: qty 5

## 2017-10-09 MED ORDER — DEXTROSE 5 % IV SOLN
1.0000 g | INTRAVENOUS | Status: DC
  Administered 2017-10-10: 1 g via INTRAVENOUS
  Filled 2017-10-09 (×2): qty 10

## 2017-10-09 MED ORDER — ACETAMINOPHEN 650 MG RE SUPP: 650.0000 mg | Freq: Four times a day (QID) | RECTAL | Status: DC | PRN

## 2017-10-09 MED ORDER — ENOXAPARIN SODIUM 40 MG/0.4ML ~~LOC~~ SOLN
40.0000 mg | SUBCUTANEOUS | Status: DC
  Administered 2017-10-10: 40 mg via SUBCUTANEOUS
  Filled 2017-10-09: qty 0.4

## 2017-10-09 MED ORDER — CITALOPRAM HYDROBROMIDE 20 MG PO TABS
10.0000 mg | ORAL_TABLET | Freq: Every day | ORAL | Status: DC
  Administered 2017-10-09 – 2017-10-10 (×2): 10 mg via ORAL
  Filled 2017-10-09 (×2): qty 1

## 2017-10-09 MED ORDER — ONDANSETRON HCL 4 MG/2ML IJ SOLN: 4.0000 mg | Freq: Four times a day (QID) | INTRAMUSCULAR | Status: DC | PRN

## 2017-10-09 MED ORDER — METOPROLOL TARTRATE 5 MG/5ML IV SOLN
5.0000 mg | Freq: Once | INTRAVENOUS | Status: AC
  Administered 2017-10-09: 5 mg via INTRAVENOUS
  Filled 2017-10-09: qty 5

## 2017-10-09 NOTE — Progress Notes (Signed)
ANTIBIOTIC CONSULT NOTE - INITIAL  Pharmacy Consult for ceftriaxone Indication: UTI  Allergies  Allergen Reactions  . Sulfa Antibiotics Itching  . Codeine Anxiety    Patient Measurements: Weight: 135 lb (61.2 kg) Adjusted Body Weight:   Vital Signs: Temp: 99.1 F (37.3 C) (12/27 1231) Temp Source: Oral (12/27 1231) BP: 139/88 (12/27 1530) Pulse Rate: 111 (12/27 1530) Intake/Output from previous day: No intake/output data recorded. Intake/Output from this shift: No intake/output data recorded.  Labs: Recent Labs    10/09/17 1227  WBC 14.2*  HGB 13.9  PLT 232  CREATININE 0.67   CrCl cannot be calculated (Unknown ideal weight.). No results for input(s): VANCOTROUGH, VANCOPEAK, VANCORANDOM, GENTTROUGH, GENTPEAK, GENTRANDOM, TOBRATROUGH, TOBRAPEAK, TOBRARND, AMIKACINPEAK, AMIKACINTROU, AMIKACIN in the last 72 hours.   Microbiology: No results found for this or any previous visit (from the past 720 hour(s)).  Medical History: Past Medical History:  Diagnosis Date  . Atrial fibrillation (HCC)   . Dementia   . Hypertension   . Peripheral edema   . Pneumonia     Medications:  Infusions:  . cefTRIAXone (ROCEPHIN)  IV 1 g (10/09/17 1556)  . [START ON 10/10/2017] cefTRIAXone (ROCEPHIN)  IV     Assessment: 89 yof cc decreased oral intake and hallucinations. PMH severe dementia, PAF, HTN. ED noted UTI and started Rocephin. Pharmacy consulted to continue ceftriaxone for UTI.  Goal of Therapy:  Resolve infection Prevent ADE  Plan:  Expected duration 5 days with resolution of temperature and/or normalization of WBC  Ceftriaxone 1 gm IV Q24H  Carola FrostNathan A Kiira Brach, Pharm.D., BCPS Clinical Pharmacist 10/09/2017,4:22 PM

## 2017-10-09 NOTE — Plan of Care (Signed)
  Progressing Education: Knowledge of General Education information will improve 10/09/2017 1854 - Progressing by Rigoberto NoelMorales, Sherice Ijames Y, RN Health Behavior/Discharge Planning: Ability to manage health-related needs will improve 10/09/2017 1854 - Progressing by Rigoberto NoelMorales, Lyndsie Wallman Y, RN Clinical Measurements: Ability to maintain clinical measurements within normal limits will improve 10/09/2017 1854 - Progressing by Rigoberto NoelMorales, Coady Train Y, RN

## 2017-10-09 NOTE — H&P (Signed)
[facility] -  at [facility]   PATIENT NAME: Shannon RileyMargie Williams    MR#:  [id number]  DATE OF BIRTH:  07-04-1922  DATE OF ADMISSION:  10/09/2017  PRIMARY CARE PHYSICIAN: Rafael BihariWalker, [name] III, MD   REQUESTING/REFERRING PHYSICIAN: dr Marisa Severinsiadecki  CHIEF COMPLAINT:   Decreased oral intake and hallucinations HISTORY OF PRESENT ILLNESS:  Shannon Williams  is a 83 y.o. female with a known history of severe dementia, PAF and essential hypertension who presents due to above issue. Patient lives at home with her daughter. Over the past week patient has had increasing hallucinations. Her primary care physician put her on Seroquel however hallucinations increased as well as the fact the patient has not had any oral intake over the past several days. Family was concerned due to this and brought her to the ER for further evaluation. In the emergency room it is noted that she has a urinary tract infection. She is started on Rocephin.  PAST MEDICAL HISTORY:   Past Medical History:  Diagnosis Date  . Atrial fibrillation (HCC)   . Dementia   . Hypertension   . Peripheral edema   . Pneumonia     PAST SURGICAL HISTORY:   Past Surgical History:  Procedure Laterality Date  . ABDOMINAL HYSTERECTOMY    . APPENDECTOMY    . BREAST SURGERY    . CHOLECYSTECTOMY    . EYE SURGERY    . FRACTURE SURGERY      SOCIAL HISTORY:   Social History   Tobacco Use  . Smoking status: Former Games developermoker  . Smokeless tobacco: Never Used  Substance Use Topics  . Alcohol use: No    FAMILY HISTORY:   Family History  Problem Relation Age of Onset  . Heart Problems Mother   . Cancer Father   . Cancer - Lung Sister     DRUG ALLERGIES:   Allergies  Allergen Reactions  . Sulfa Antibiotics Itching  . Codeine Anxiety    REVIEW OF SYSTEMS:   Review of Systems  Unable to perform ROS: Dementia    MEDICATIONS AT HOME:   Prior to Admission medications   Medication Sig Start Date End Date  Taking? Authorizing Provider  pantoprazole (PROTONIX) 20 MG tablet Take 20 mg by mouth 2 (two) times daily. 09/19/17  Yes [provider]  QUEtiapine (SEROQUEL) 25 MG tablet Take 25 mg by mouth 2 (two) times daily. 09/15/17  Yes [provider]  acetaminophen (TYLENOL) 500 MG tablet Take 1,000 mg by mouth every 6 (six) hours as needed for mild pain.     [provider]  aspirin EC 325 MG tablet Take 1 tablet (325 mg total) by mouth daily. 10/28/15   Alford HighlandWieting, Richard, MD  bisacodyl (DULCOLAX) 10 MG suppository Place 1 suppository (10 mg total) rectally daily as needed for moderate constipation. 10/28/15   Alford HighlandWieting, Richard, MD  citalopram (CELEXA) 10 MG tablet Take 10 mg by mouth at bedtime.     [provider]  diltiazem (CARDIZEM CD) 120 MG 24 hr capsule Take 1 capsule (120 mg total) by mouth daily. 10/28/15   Alford HighlandWieting, Richard, MD  docusate sodium (COLACE) 100 MG capsule Take 100 mg by mouth 2 (two) times daily as needed for mild constipation.     [provider]  magnesium oxide (MAG-OX) 400 (241.3 Mg) MG tablet Take 1 tablet (400 mg total) by mouth daily. 10/28/15   Alford HighlandWieting, Richard, MD  polyethylene glycol (MIRALAX / Ethelene HalGLYCOLAX) packet Take 17 g by  mouth daily as needed for moderate constipation. 10/28/15   Alford HighlandWieting, Richard, MD      VITAL SIGNS:  Blood pressure 139/88, pulse (!) 111, temperature 99.1 F (37.3 C), temperature source Oral, resp. rate (!) 21, weight 61.2 kg (135 lb), SpO2 96 %.  PHYSICAL EXAMINATION:   Physical Exam  Constitutional: She is well-developed, well-nourished, and in no distress. No distress.  HENT:  Head: Normocephalic.  Eyes: No scleral icterus.  Neck: Normal range of motion. Neck supple. No JVD present. No tracheal deviation present.  Cardiovascular: Exam reveals no gallop and no friction rub.  Murmur heard. Tachycardia irregular, irregular  Pulmonary/Chest: Effort normal and breath sounds normal. No respiratory distress.  She has no wheezes. She has no rales. She exhibits no tenderness.  Abdominal: Soft. Bowel sounds are normal. She exhibits no distension and no mass. There is no tenderness. There is no rebound and no guarding.  Musculoskeletal: Normal range of motion. She exhibits no edema.  Neurological: She is alert.  Skin: Skin is warm. No rash noted. She is not diaphoretic. No erythema.  Some bruising noted on her skin  Psychiatric:  Dementia      LABORATORY PANEL:   CBC Recent Labs  Lab 10/09/17 1227  WBC 14.2*  HGB 13.9  HCT 42.6  PLT 232   ------------------------------------------------------------------------------------------------------------------  Chemistries  Recent Labs  Lab 10/09/17 1227  NA 137  K 3.8  CL 102  CO2 22  GLUCOSE 80  BUN 14  CREATININE 0.67  CALCIUM 9.0   ------------------------------------------------------------------------------------------------------------------  Cardiac Enzymes Recent Labs  Lab 10/09/17 1227  TROPONINI <0.03   ------------------------------------------------------------------------------------------------------------------  RADIOLOGY:  Dg Chest Portable 1 View  Result Date: 10/09/2017 CLINICAL DATA:  Weakness.  Evaluate for pneumonia. EXAM: PORTABLE CHEST 1 VIEW COMPARISON:  Chest x-ray dated October 23, 2015. FINDINGS: The cardiomediastinal silhouette is normal in size. Normal pulmonary vascularity. Atherosclerotic calcification of the aortic arch. No focal consolidation, pleural effusion, or pneumothorax. No acute osseous abnormality. IMPRESSION: No active disease. Electronically Signed   By: Obie DredgeWilliam T Derry M.D.   On: 10/09/2017 13:49    EKG:  Atrial fibrillation with RVR no ST elevation or depression  IMPRESSION AND PLAN:   83 year old female with PAF, hypertension and severe dementia who presents due to poor by mouth intake  1. Atrial fibrillation with RVR: Patient received 5 mg IV of metoprolol in the emergency  room I will give diltiazem 10 mg IV once Restart oral medications if patient is able to take in oral medications Continue telemetry  2. Urinary tract infection: Continue Rocephin and follow up on urine culture.   3. Severe dementia: Continue Celexa, Seroquel Palliative care consultation for progressive disease.   4. Essential hypertension: Continue diltiazem     All the records are reviewed and case discussed with ED provider. Management plans discussed with the patient's daughter and she is in agreement  CODE STATUS: DNR  TOTAL TIME TAKING CARE OF THIS PATIENT: 40 minutes.    Zakhai Meisinger M.D on 10/09/2017 at 4:00 PM  Between 7am to 6pm - Pager - [phone]  After 6pm go to [email or web address] - password [email or web address]  [facility]  Office  [phone]  CC: Primary care physician; Rafael BihariWalker, [name] III, MD

## 2017-10-09 NOTE — ED Provider Notes (Signed)
[facility] Emergency Department Provider Note ____________________________________________   First MD Initiated Contact with Patient 10/09/17 1301     (approximate)  I have reviewed the triage vital signs and the nursing notes.   HISTORY  Chief Complaint Failure To Thrive  History of present illness significantly limited due to dementia.  HPI Shannon Williams is a 83 y.o. female with past medical history as noted below who presents with generalized weakness and decreased p.o. intake over the last several days, gradual onset, worsening course, and associated with increased confusion.  Patient family member state that she was becoming increasingly confused and agitated especially at night, so was started on Seroquel approximately 1 month ago but over the last week has had increased weakness, decreased oral intake, and has been more somnolent than usual.  The Seroquel was discontinued 1 day ago, and the patient is now more alert than she has been.  She has been unable to take her diltiazem or any of her other normal medications.  Past Medical History:  Diagnosis Date  . Atrial fibrillation (HCC)   . Dementia   . Hypertension   . Peripheral edema   . Pneumonia     Patient Active Problem List   Diagnosis Date Noted  . Syncope 10/23/2015  . Dehydration 10/23/2015  . AKI (acute kidney injury) (HCC) 10/23/2015  . Constipation 10/23/2015  . Acute encephalopathy 02/26/2015  . UTI (lower urinary tract infection) 02/26/2015  . Chronic a-fib (HCC) 02/26/2015  . HTN (hypertension) 02/26/2015  . Dementia 02/26/2015  . Encephalopathy acute 02/26/2015    Past Surgical History:  Procedure Laterality Date  . ABDOMINAL HYSTERECTOMY    . APPENDECTOMY    . BREAST SURGERY    . CHOLECYSTECTOMY    . EYE SURGERY    . FRACTURE SURGERY      Prior to Admission medications   Medication Sig Start Date End Date Taking? Authorizing Provider  acetaminophen (TYLENOL) 500  MG tablet Take 1,000 mg by mouth every 6 (six) hours as needed for mild pain.     [provider]  aspirin EC 325 MG tablet Take 1 tablet (325 mg total) by mouth daily. 10/28/15   Alford Highland, MD  bisacodyl (DULCOLAX) 10 MG suppository Place 1 suppository (10 mg total) rectally daily as needed for moderate constipation. 10/28/15   Alford Highland, MD  citalopram (CELEXA) 10 MG tablet Take 10 mg by mouth at bedtime.     [provider]  diltiazem (CARDIZEM CD) 120 MG 24 hr capsule Take 1 capsule (120 mg total) by mouth daily. 10/28/15   Alford Highland, MD  docusate sodium (COLACE) 100 MG capsule Take 100 mg by mouth 2 (two) times daily as needed for mild constipation.     [provider]  magnesium oxide (MAG-OX) 400 (241.3 Mg) MG tablet Take 1 tablet (400 mg total) by mouth daily. 10/28/15   Alford Highland, MD  polyethylene glycol (MIRALAX / Ethelene Hal) packet Take 17 g by mouth daily as needed for moderate constipation. 10/28/15   Alford Highland, MD    Allergies Sulfa antibiotics and Codeine  Family History  Problem Relation Age of Onset  . Heart Problems Mother   . Cancer Father   . Cancer - Lung Sister     Social History Social History   Tobacco Use  . Smoking status: Former Games developer  . Smokeless tobacco: Never Used  Substance Use Topics  . Alcohol use: No  . Drug use: No  Review of Systems Level V caveat: Unable to obtain review of systems due to dementia    ____________________________________________   PHYSICAL EXAM:  VITAL SIGNS: ED Triage Vitals  Enc Vitals Group     BP 10/09/17 1231 (!) 155/71     Pulse Rate 10/09/17 1231 (!) 148     Resp 10/09/17 1231 (!) 23     Temp 10/09/17 1231 99.1 F (37.3 C)     Temp Source 10/09/17 1231 Oral     SpO2 10/09/17 1231 97 %     Weight 10/09/17 1233 135 lb (61.2 kg)     Height --      Head Circumference --      Peak Flow --      Pain Score --      Pain Loc --      Pain Edu? --       Excl. in GC? --     Constitutional: Alert, making some incomprehensible sounds and responding to commands. Eyes: Conjunctivae are normal.  EOMI.  PERRLA. Head: Atraumatic. Nose: No congestion/rhinnorhea. Mouth/Throat: Mucous membranes are dry.   Neck: Normal range of motion.  Cardiovascular: Tachycardic, irregular rhythm. Grossly normal heart sounds.  Good peripheral circulation. Respiratory: Normal respiratory effort.  No retractions. Lungs CTAB. Gastrointestinal: Soft and nontender. No distention.  Genitourinary: No flank tenderness. Musculoskeletal: No lower extremity edema.  Extremities warm and well perfused.  Neurologic: Motor intact in all extremities.  No gross focal neurologic deficits are appreciated.  Skin:  Skin is warm and dry. No rash noted. Psychiatric: Unable to assess.  ____________________________________________   LABS (all labs ordered are listed, but only abnormal results are displayed)  Labs Reviewed  CBC - Abnormal; Notable for the following components:      Result Value   WBC 14.2 (*)    RDW 14.7 (*)    All other components within normal limits  URINALYSIS, COMPLETE (UACMP) WITH MICROSCOPIC - Abnormal; Notable for the following components:   Color, Urine YELLOW (*)    APPearance CLOUDY (*)    Hgb urine dipstick MODERATE (*)    Ketones, ur 20 (*)    Protein, ur 30 (*)    Leukocytes, UA MODERATE (*)    Squamous Epithelial / LPF 0-5 (*)    All other components within normal limits  BASIC METABOLIC PANEL  TROPONIN I  INFLUENZA PANEL BY PCR (TYPE A & B)   ____________________________________________  EKG  ED ECG REPORT I, Dionne BucySebastian Joanny Dupree, the attending physician, personally viewed and interpreted this ECG.  Date: 10/09/2017 EKG Time: 1227 Rate: 122 Rhythm: Atrial fibrillation with RVR QRS Axis: normal Intervals: normal ST/T Wave abnormalities: nonspecific findings Narrative Interpretation: no evidence of acute ischemia; other than rate, no  significant changes from EKG of 10/23/2015  ____________________________________________  RADIOLOGY  CXR: No focal infiltrate or other acute findings  ____________________________________________   PROCEDURES  Procedure(s) performed: No    Critical Care performed: No ____________________________________________   INITIAL IMPRESSION / ASSESSMENT AND PLAN / ED COURSE  Pertinent labs & imaging results that were available during my care of the patient were reviewed by me and considered in my medical decision making (see chart for details).  83 year old female with past medical history as noted below including history of significant dementia who presents with worsening generalized weakness and decreased p.o. intake over the last week.  She has been unable to take any of her normal medications including diltiazem.  Past medical records reviewed in epic and are noncontributory.  On exam, the patient is in rapid A. fib in the 130s-150s, and her mucous membranes are dry, but the remainder the exam is unremarkable.    Differential is broad but includes, dehydration or other metabolic cause, infection, primary cardiac cause, or worsening dementia.  No focal neuro findings, evidence of acute CNS process or indication for CT head.  Plan for labs and infectious workup, IV medication for rate control, fluids, and reassess.    ----------------------------------------- 3:42 PM on 10/09/2017 -----------------------------------------  Patient's workup reveals UA consistent with UTI, and elevated white blood cell count but negative chest x-ray.  Heart rate significantly improved after metoprolol.  Patient will require admission for IV antibiotics and she is unable to tolerate p.o. and is too confused and weak.  I explained the findings of the workup and the care plan to the patient's family members.  I signed the patient out to the hospitalist for  admission. ____________________________________________   FINAL CLINICAL IMPRESSION(S) / ED DIAGNOSES  Final diagnoses:  Urinary tract infection without hematuria, site unspecified  Dehydration  Atrial fibrillation, unspecified type (HCC)      NEW MEDICATIONS STARTED DURING THIS VISIT:  This SmartLink is deprecated. Use AVSMEDLIST instead to display the medication list for a patient.   Note:  This document was prepared using Dragon voice recognition software and may include unintentional dictation errors.    Dionne BucySiadecki, Araceli Arango, MD 10/09/17 [phone]

## 2017-10-09 NOTE — Progress Notes (Signed)
Family Meeting Note  Advance Directive:no  Today a meeting took place with the daughter.    The following clinical team members were present during this meeting:MD  The following were discussed:Patient's diagnosis: Atrial fibrillation with RVR Severe dementia UTI , Patient's progosis: < 12 months and Goals for treatment: DNR  Additional follow-up to be provided: Palliative care consultation  Time spent during discussion: 16 minutes  Shannon Doering, MD

## 2017-10-09 NOTE — ED Notes (Signed)
Admitting MD at bedside.

## 2017-10-09 NOTE — ED Triage Notes (Signed)
Pt to ED via EMS from home c/o failure to thrive, per EMS no PO for 48 hours, 97% RA, 95 CBG, usually takes diltiazem and HR between 110-130.  Family has been giving patient her Seroquel.

## 2017-10-10 LAB — CBC
HCT: 38.7 % (ref 35.0–47.0)
HEMOGLOBIN: 12.7 g/dL (ref 12.0–16.0)
MCH: 29.5 pg (ref 26.0–34.0)
MCHC: 32.8 g/dL (ref 32.0–36.0)
MCV: 89.9 fL (ref 80.0–100.0)
PLATELETS: 217 10*3/uL (ref 150–440)
RBC: 4.31 MIL/uL (ref 3.80–5.20)
RDW: 14.6 % — ABNORMAL HIGH (ref 11.5–14.5)
WBC: 9.4 10*3/uL (ref 3.6–11.0)

## 2017-10-10 LAB — BASIC METABOLIC PANEL
ANION GAP: 10 (ref 5–15)
BUN: 18 mg/dL (ref 6–20)
CALCIUM: 8.9 mg/dL (ref 8.9–10.3)
CO2: 21 mmol/L — AB (ref 22–32)
CREATININE: 0.63 mg/dL (ref 0.44–1.00)
Chloride: 107 mmol/L (ref 101–111)
GLUCOSE: 89 mg/dL (ref 65–99)
Potassium: 3.1 mmol/L — ABNORMAL LOW (ref 3.5–5.1)
Sodium: 138 mmol/L (ref 135–145)

## 2017-10-10 LAB — MAGNESIUM: MAGNESIUM: 1.9 mg/dL (ref 1.7–2.4)

## 2017-10-10 MED ORDER — POTASSIUM CHLORIDE 20 MEQ PO PACK
60.0000 meq | PACK | Freq: Once | ORAL | Status: AC
Start: 2017-10-10 — End: 2017-10-10
  Administered 2017-10-10: 60 meq via ORAL
  Filled 2017-10-10: qty 3

## 2017-10-10 NOTE — Progress Notes (Signed)
Requested by [facility] Joni Reining to speak with patient's daughter Barnetta Chapel regarding patient's eligibility for hospice services. Writer met in the room Shannon Williams, patient initially was sleeping but awakened and was able to answer simple questions. Patient has not had any significant weight loss, only 1 pound difference since last hospital admission on January of  2017. At this time patient does not appear to be appropriate for hospice services. Barnetta Chapel was agreeable to having out patient Palliative at discharge. CMR[facility] Nann Carlota Raspberry updated. Patient information faxed to referral. Flo Shanks RN, BSN, [facility] [place], hospital Liaison [phone]

## 2017-10-10 NOTE — Progress Notes (Signed)
   [facility] - [place] at [facility]   PATIENT NAME: Shannon RileyMargie Williams    MR#:  [id number]  DATE OF BIRTH:  06/11/1922  SUBJECTIVE:  CHIEF COMPLAINT:   Chief Complaint  Patient presents with  . Failure To Thrive   The patient is demented and noncommunicative. REVIEW OF SYSTEMS:  Review of Systems  Unable to perform ROS: Dementia    DRUG ALLERGIES:   Allergies  Allergen Reactions  . Sulfa Antibiotics Itching  . Codeine Anxiety   VITALS:  Blood pressure (!) 96/46, pulse 74, temperature 98.6 F (37 C), temperature source Oral, resp. rate 18, height 5\' 4"  (1.626 m), weight 135 lb (61.2 kg), SpO2 100 %. PHYSICAL EXAMINATION:  Physical Exam  Constitutional: She is well-developed, well-nourished, and in no distress.  HENT:  Head: Normocephalic.  Mouth/Throat: Oropharynx is clear and moist.  Eyes: No scleral icterus.  Neck: Neck supple. No JVD present.  Cardiovascular: Normal rate, regular rhythm and normal heart sounds. Exam reveals no gallop.  No murmur heard. Pulmonary/Chest: Effort normal and breath sounds normal. No respiratory distress. She has no wheezes. She has no rales.  Abdominal: Soft. Bowel sounds are normal. She exhibits no distension. There is no tenderness. There is no rebound.  Musculoskeletal: She exhibits no edema or tenderness.  Neurological:  Does not follow commands, unable to exam.  Skin: No rash noted. No erythema.   LABORATORY PANEL:  Female CBC Recent Labs  Lab 10/10/17 0443  WBC 9.4  HGB 12.7  HCT 38.7  PLT 217   ------------------------------------------------------------------------------------------------------------------ Chemistries  Recent Labs  Lab 10/10/17 0443  NA 138  K 3.1*  CL 107  CO2 21*  GLUCOSE 89  BUN 18  CREATININE 0.63  CALCIUM 8.9  MG 1.9   RADIOLOGY:  Dg Chest Portable 1 View  Result Date: 10/09/2017 CLINICAL DATA:  Weakness.  Evaluate for pneumonia. EXAM: PORTABLE CHEST 1 VIEW  COMPARISON:  Chest x-ray dated October 23, 2015. FINDINGS: The cardiomediastinal silhouette is normal in size. Normal pulmonary vascularity. Atherosclerotic calcification of the aortic arch. No focal consolidation, pleural effusion, or pneumothorax. No acute osseous abnormality. IMPRESSION: No active disease. Electronically Signed   By: Obie DredgeWilliam T Derry M.D.   On: 10/09/2017 13:49   ASSESSMENT AND PLAN:   83 year old female with PAF, hypertension and severe dementia who presents due to poor by mouth intake  1. Atrial fibrillation with RVR: Patient received 5 mg IV of metoprolol in the emergency room She was given diltiazem 10 mg IV once Restarted oral medications.  2. Urinary tract infection: Continue Rocephin and follow up on urine culture.   3. Severe dementia: Continue Celexa, Seroquel Palliative care consultation for progressive disease.  4. Essential hypertension: Continue diltiazem  All the records are reviewed and case discussed with Care Management/Social Worker. Management plans discussed with the patient's daughter and they are in agreement.  CODE STATUS: DNR  TOTAL TIME TAKING CARE OF THIS PATIENT: 35 minutes.   More than 50% of the time was spent in counseling/coordination of care: YES  POSSIBLE D/C IN 1-2 DAYS, DEPENDING ON CLINICAL CONDITION.   Shaune PollackQing Angee Gupton M.D on 10/10/2017 at 1:34 PM  Between 7am to 6pm - Pager - [phone]  After 6pm go to [email or web address] - Therapist, nutritionalpassword [email or web address]  [facility] [place] [facility]

## 2017-10-10 NOTE — Care Management Note (Signed)
Case Management Note  Patient Details  Name: Shannon Williams MRN: [id number] Date of Birth: [birth year]  Subjective/Objective:                 Patient lives with her daughter Shannon Williams who is the primary caregiver.  Patient has not ambulated in over 7 years.  Family uses hoyer lift to get patient out of bed to chair. She has severe dementia and requested palliative consult.  Found however after speaking with patient daughter, she would like to speak with hospice liasion to determine if patient meets hospice criteria. Patient is DNR and has portable DNR.  She had hospice services short term "several years ago" and services were stopped as patient did not meet criteria.  Shannon Williams says [facility] and was informed patient did not meet criteria.  Patient is total care and dementia is progressing.  Nutritional intakes has been on the decline.  Shannon Williams speaks about need for caregiver assistance.   CM discussed that hospice services would not include in home continuous in home caregiver assist and Shannon Williams verbalizes understanding of this. She would like to speak with [facility] representative to determine if patient would meet criteria for in home hospice. If not, may benefit from outpatient palliative services.  Will require ems for transport home  Action/Plan:  Completed ems transport document.  May need to revise the transport date. Expected Discharge Date:                  Expected Discharge Plan:     In-House Referral:     Discharge planning Services     Post Acute Care Choice:    Choice offered to:     DME Arranged:    DME Agency:     HH Arranged:    HH Agency:     Status of Service:     If discussed at [employer] of [employer] Meetings, dates discussed:    Additional Comments:  Shannon Williams, Shannon Warmack R, RN 10/10/2017, 12:06 PM

## 2017-10-10 NOTE — Progress Notes (Signed)
PT Cancellation Note  Patient Details Name: Shannon Williams MRN: [id number] DOB: [birth year]   Cancelled Treatment:    Reason Eval/Treat Not Completed: PT screened, no needs identified, will sign off.  Spoke with pt's daughter at bedside.  Shannon Williams has been non ambulatory for several years.  Daughter is primary caregiver and provides 24/7 assist.  Shannon SitesHoyer lift is used to assist pt to her lift chair during the day.  Daughter has been practicing proper repositioning techniques. Pt has a hospital bed.  Daughter confirmed that pt has all needed equipment.  No skilled PT needs identified, PT will sign off.    Encarnacion ChuAshley Abashian PT, DPT 10/10/2017, 9:39 AM

## 2017-10-10 NOTE — Plan of Care (Signed)
  Progressing Education: Knowledge of General Education information will improve 10/10/2017 1427 - Progressing by Tomie ChinaJackson, Laurielle Selmon Cecelie, RN Health Behavior/Discharge Planning: Ability to manage health-related needs will improve 10/10/2017 1427 - Progressing by Tomie ChinaJackson, Azaylah Stailey Cecelie, RN Clinical Measurements: Ability to maintain clinical measurements within normal limits will improve 10/10/2017 1427 - Progressing by Tomie ChinaJackson, Lillah Standre Cecelie, RN Will remain free from infection 10/10/2017 1427 - Progressing by Tomie ChinaJackson, Perez Dirico Cecelie, RN Diagnostic test results will improve 10/10/2017 1427 - Progressing by Tomie ChinaJackson, Pelham Hennick Cecelie, RN Respiratory complications will improve 10/10/2017 1427 - Progressing by Tomie ChinaJackson, Avelynn Sellin Cecelie, RN Cardiovascular complication will be avoided 10/10/2017 1427 - Progressing by Tomie ChinaJackson, Adolf Ormiston Cecelie, RN Activity: Risk for activity intolerance will decrease 10/10/2017 1427 - Progressing by Tomie ChinaJackson, Garmon Dehn Cecelie, RN Nutrition: Adequate nutrition will be maintained 10/10/2017 1427 - Progressing by Tomie ChinaJackson, Afreen Siebels Cecelie, RN Coping: Level of anxiety will decrease 10/10/2017 1427 - Progressing by Tomie ChinaJackson, Christiana Gurevich Cecelie, RN Elimination: Will not experience complications related to bowel motility 10/10/2017 1427 - Progressing by Tomie ChinaJackson, Briany Aye Cecelie, RN Will not experience complications related to urinary retention 10/10/2017 1427 - Progressing by Tomie ChinaJackson, Helmer Dull Cecelie, RN Pain Managment: General experience of comfort will improve 10/10/2017 1427 - Progressing by Tomie ChinaJackson, Purnell Daigle Cecelie, RN Safety: Ability to remain free from injury will improve 10/10/2017 1427 - Progressing by Tomie ChinaJackson, Gennavieve Huq Cecelie, RN Skin Integrity: Risk for impaired skin integrity will decrease 10/10/2017 1427 - Progressing by Tomie ChinaJackson, Kiaja Shorty Cecelie, RN

## 2017-10-10 NOTE — Progress Notes (Signed)
No charge note  Discussed with case management.  Family has chosen hospice care.  Case management will consult hospice.   Please call PMT if we are needed.  At this point Case management has advised that the consult is no longer needed.  Thank you   Norvel RichardsMarianne Kailan Laws, PA-C Palliative Medicine Pager: [phone]

## 2017-10-11 LAB — BASIC METABOLIC PANEL
Anion gap: 8 (ref 5–15)
BUN: 16 mg/dL (ref 6–20)
CHLORIDE: 106 mmol/L (ref 101–111)
CO2: 23 mmol/L (ref 22–32)
CREATININE: 0.58 mg/dL (ref 0.44–1.00)
Calcium: 8.4 mg/dL — ABNORMAL LOW (ref 8.9–10.3)
GFR calc non Af Amer: >60 mL/min (ref >60)
Glucose, Bld: 91 mg/dL (ref 65–99)
POTASSIUM: 3.2 mmol/L — AB (ref 3.5–5.1)
Sodium: 137 mmol/L (ref 135–145)

## 2017-10-11 LAB — POTASSIUM: POTASSIUM: 4.2 mmol/L (ref 3.5–5.1)

## 2017-10-11 MED ORDER — CEPHALEXIN 500 MG PO CAPS
500.0000 mg | ORAL_CAPSULE | Freq: Two times a day (BID) | ORAL | Status: DC
  Administered 2017-10-11: 500 mg via ORAL
  Filled 2017-10-11: qty 1

## 2017-10-11 MED ORDER — AMOXICILLIN 500 MG PO CAPS
500.0000 mg | ORAL_CAPSULE | Freq: Three times a day (TID) | ORAL | Status: DC
  Filled 2017-10-11 (×2): qty 1

## 2017-10-11 MED ORDER — POTASSIUM CHLORIDE 20 MEQ PO PACK
40.0000 meq | PACK | Freq: Once | ORAL | Status: AC
  Administered 2017-10-11: 40 meq via ORAL
  Filled 2017-10-11: qty 2

## 2017-10-11 MED ORDER — PANTOPRAZOLE SODIUM 40 MG PO TBEC
40.0000 mg | DELAYED_RELEASE_TABLET | Freq: Two times a day (BID) | ORAL | Status: DC
  Administered 2017-10-11: 40 mg via ORAL
  Filled 2017-10-11: qty 1

## 2017-10-11 MED ORDER — AMOXICILLIN 500 MG PO CAPS: 500.0000 mg | ORAL_CAPSULE | Freq: Three times a day (TID) | ORAL | 0 refills | Status: AC

## 2017-10-11 MED ORDER — POTASSIUM CHLORIDE 10 MEQ/100ML IV SOLN
10.0000 meq | INTRAVENOUS | Status: DC
  Filled 2017-10-11 (×4): qty 100

## 2017-10-11 MED ORDER — CEPHALEXIN 500 MG PO CAPS: 500.0000 mg | ORAL_CAPSULE | Freq: Two times a day (BID) | ORAL | 0 refills | Status: AC

## 2017-10-11 MED ORDER — QUETIAPINE FUMARATE 25 MG PO TABS: 25.0000 mg | ORAL_TABLET | Freq: Two times a day (BID) | ORAL | Status: DC

## 2017-10-11 NOTE — Progress Notes (Addendum)
MEDICATION RELATED CONSULT NOTE - INITIAL   Pharmacy Consult for Electrolyte Management Indication: Hypokalemia  Allergies  Allergen Reactions  . Sulfa Antibiotics Itching  . Codeine Anxiety    Patient Measurements: Height: 5\' 4"  (162.6 cm) Weight: 135 lb (61.2 kg) IBW/kg (Calculated) : 54.7  Vital Signs: Temp: 98.7 F (37.1 C) (12/29 0453) Temp Source: Axillary (12/29 0453) BP: 135/81 (12/29 0810) Pulse Rate: 119 (12/29 0810) Intake/Output from previous day: 12/28 0701 - 12/29 0700 In: 1040 [P.O.:240; I.V.:750; IV Piggyback:50] Out: 200 [Urine:200] Intake/Output from this shift: No intake/output data recorded.  Labs: Recent Labs    10/09/17 1227 10/10/17 0443 10/11/17 0549  WBC 14.2* 9.4  --   HGB 13.9 12.7  --   HCT 42.6 38.7  --   PLT 232 217  --   CREATININE 0.67 0.63 0.58  MG  --  1.9  --    Estimated Creatinine Clearance: 37.9 mL/min (by C-G formula based on SCr of 0.58 mg/dL).   Microbiology: No results found for this or any previous visit (from the past 720 hour(s)).  Medical History: Past Medical History:  Diagnosis Date  . Atrial fibrillation (HCC)   . Dementia   . Hypertension   . Peripheral edema   . Pneumonia     Medications:  Scheduled:  . aspirin EC  325 mg Oral Daily  . cephALEXin  500 mg Oral Q12H  . citalopram  10 mg Oral QHS  . diltiazem  120 mg Oral Daily  . enoxaparin (LOVENOX) injection  40 mg Subcutaneous Q24H  . magnesium oxide  400 mg Oral Daily  . pantoprazole  40 mg Oral BID  . QUEtiapine  25 mg Oral BID   Infusions:  . potassium chloride      Assessment: MD requested potassium replacement for this 83 yo female with K = 3.2  Goal of Therapy:  K = 3.5-5.1  Plan:  Ordered KCL 40meq PO x 1. Will recheck K level today at 14:00.  Stormy CardKatsoudas,Django Nguyen K, RPH 10/11/2017,9:02 AM

## 2017-10-11 NOTE — Care Management Note (Addendum)
Case Management Note  Patient Details  Name: Alphonzo SeveranceMargie F Vangorden MRN: [id number] Date of Birth: [birth year]  Subjective/Objective:    Per phone call from MD, Mrs Yellen's daughter is requesting in her home Hospice services by [facility]. Mrs Bess KindsKimrey has severe dementia and her food intake has decreased. Mrs Bess KindsKimrey is total care at home. She has a hoyer lift and a hospital bed. If Mrs Bess KindsKimrey is not hospice appropriate, then daughter would like to have a palliative care evaluation by [facility] [facility]. Mrs Bess KindsKimrey is being discharged home today.  A referral has been e-mailed and faxed to wanda [facility] [place] [facility].             Action/Plan:   Expected Discharge Date:                  Expected Discharge Plan:  Home w Hospice Care  In-House Referral:     Discharge planning Services  CM Consult  Post Acute Care Choice:  Hospice Choice offered to:  Adult Children  DME Arranged:  N/A DME Agency:  NA  HH Arranged:  (TBD by Hospice) HH Agency:  [facility] [place] [facility]  Status of Service:  Completed, signed off  If discussed at Long Length of Stay Meetings, dates discussed:    Additional Comments:  Neev Mcmains A, RN 10/11/2017, 9:23 AM

## 2017-10-11 NOTE — Discharge Summary (Addendum)
[facility] - [place] at [facility]   PATIENT NAME: Shannon RileyMargie Williams    MR#:  [id number]  DATE OF BIRTH:  [birth year]  DATE OF ADMISSION:  10/09/2017 ADMITTING PHYSICIAN: Adrian SaranSital Mody, MD  DATE OF DISCHARGE: 10/11/2017  PRIMARY CARE PHYSICIAN: Marletta LorBarr, Julie, NP    ADMISSION DIAGNOSIS:  Dehydration [E86.0] Urinary tract infection without hematuria, site unspecified [N39.0] Atrial fibrillation, unspecified type (HCC) [I48.91]  DISCHARGE DIAGNOSIS:  UTI Advanced dementia  SECONDARY DIAGNOSIS:   Past Medical History:  Diagnosis Date  . Atrial fibrillation (HCC)   . Dementia   . Hypertension   . Peripheral edema   . Pneumonia     HOSPITAL COURSE:  83 year old female with PAF, hypertension and severe dementia who presents due to poor by mouth intake  1. Atrial fibrillation with RVR: Patient received 5 mg IV of metoprolol  Restarted oral CCB 120 mg daily. HR stable  2. Urinary tract infection:  -UC growing enterococcus -pt was on IV rocephin---change to  amoxillicin  3. Severe dementia: Continue Celexa, Seroquel  4. Essential hypertension: Continue diltiazem  Patient overall is at baseline.  Discussed with patient's daughter Shannon Williams.  They are interested in hospice evaluation at home.  Spoke with care management was not arranged.  Will get home health RN and aide in the meantime to help with patient and her advanced dementia and overall decline.  Patient will discharge home once potassium is repleted.  CONSULTS OBTAINED:    DRUG ALLERGIES:   Allergies  Allergen Reactions  . Sulfa Antibiotics Itching  . Codeine Anxiety    DISCHARGE MEDICATIONS:   Allergies as of 10/11/2017      Reactions   Sulfa Antibiotics Itching   Codeine Anxiety      Medication List    TAKE these medications   acetaminophen 500 MG tablet Commonly known as:  TYLENOL Take 1,000 mg by mouth every 6 (six) hours as needed for mild pain.   amoxicillin 500 MG  capsule Commonly known as:  AMOXIL Take 1 capsule (500 mg total) by mouth every 8 (eight) hours.   aspirin EC 325 MG tablet Take 1 tablet (325 mg total) by mouth daily.   bisacodyl 10 MG suppository Commonly known as:  DULCOLAX Place 1 suppository (10 mg total) rectally daily as needed for moderate constipation.   cephALEXin 500 MG capsule Commonly known as:  KEFLEX Take 1 capsule (500 mg total) by mouth every 12 (twelve) hours.   citalopram 10 MG tablet Commonly known as:  CELEXA Take 10 mg by mouth at bedtime.   diltiazem 120 MG 24 hr capsule Commonly known as:  CARDIZEM CD Take 1 capsule (120 mg total) by mouth daily.   docusate sodium 100 MG capsule Commonly known as:  COLACE Take 100 mg by mouth 2 (two) times daily as needed for mild constipation.   magnesium oxide 400 (241.3 Mg) MG tablet Commonly known as:  MAG-OX Take 1 tablet (400 mg total) by mouth daily.   pantoprazole 20 MG tablet Commonly known as:  PROTONIX Take 20 mg by mouth 2 (two) times daily.   polyethylene glycol packet Commonly known as:  MIRALAX / GLYCOLAX Take 17 g by mouth daily as needed for moderate constipation.   QUEtiapine 25 MG tablet Commonly known as:  SEROQUEL Take 25 mg by mouth 2 (two) times daily.       If you experience worsening of your admission symptoms, develop shortness of breath, life threatening emergency, suicidal or homicidal thoughts you  must seek medical attention immediately by calling 911 or calling your MD immediately  if symptoms less severe.  You Must read complete instructions/literature along with all the possible adverse reactions/side effects for all the Medicines you take and that have been prescribed to you. Take any new Medicines after you have completely understood and accept all the possible adverse reactions/side effects.   Please note  You were cared for by a hospitalist during your hospital stay. If you have any questions about your discharge  medications or the care you received while you were in the hospital after you are discharged, you can call the unit and asked to speak with the hospitalist on call if the hospitalist that took care of you is not available. Once you are discharged, your primary care physician will handle any further medical issues. Please note that NO REFILLS for any discharge medications will be authorized once you are discharged, as it is imperative that you return to your primary care physician (or establish a relationship with a primary care physician if you do not have one) for your aftercare needs so that they can reassess your need for medications and monitor your lab values. Today   SUBJECTIVE  Dementia patient is nonverbal at present.  Daughter in the room.  She is awakened upon verbal command.  No new issues per RN.  Per daughter patient has been bedbound for last 7 years.   VITAL SIGNS:  Blood pressure 135/81, pulse (!) 119, temperature 98.7 F (37.1 C), temperature source Axillary, resp. rate 18, height 5\' 4"  (1.626 m), weight 61.2 kg (135 lb), SpO2 99 %.  I/O:    Intake/Output Summary (Last 24 hours) at 10/11/2017 1420 Last data filed at 10/11/2017 1030 Gross per 24 hour  Intake 1230 ml  Output 200 ml  Net 1030 ml    PHYSICAL EXAMINATION:  GENERAL:  83 y.o.-year-old patient lying in the bed with no acute distress.  EYES: Pupils equal, round, reactive to light and accommodation. No scleral icterus.HEENT: Head atraumatic, normocephalic. Oropharynx and nasopharynx clear.  NECK:  Supple, no jugular venous distention. No thyroid enlargement, no tenderness.  LUNGS: Normal breath sounds bilaterally, no wheezing, rales,rhonchi or crepitation. No use of accessory muscles of respiration.  CARDIOVASCULAR: S1, S2 normal. No murmurs, rubs, or gallops.  ABDOMEN: Soft, non-tender, non-distended. Bowel sounds present. No organomegaly or mass.  EXTREMITIES: No pedal edema, cyanosis, or clubbing.   NEUROLOGIC: Moves extremities spontaneously.  Unable to assess neurologically given advanced dementia PSYCHIATRIC: The patient is alert SKIN: No obvious rash, lesion, or ulcer.   DATA REVIEW:   CBC  Recent Labs  Lab 10/10/17 0443  WBC 9.4  HGB 12.7  HCT 38.7  PLT 217    Chemistries  Recent Labs  Lab 10/10/17 0443 10/11/17 0549  NA 138 137  K 3.1* 3.2*  CL 107 106  CO2 21* 23  GLUCOSE 89 91  BUN 18 16  CREATININE 0.63 0.58  CALCIUM 8.9 8.4*  MG 1.9  --     Microbiology Results   Recent Results (from the past 240 hour(s))  Urine culture     Status: Abnormal (Preliminary result)   Collection Time: 10/09/17  1:39 PM  Result Value Ref Range Status   Specimen Description   Final    URINE, RANDOM Performed at [facility], [address] [place], [place] [address]    Special Requests   Final    NONE Performed at [facility], [address]  [place], [place] [address]    Culture (A)  Final    >=100,000 COLONIES/mL ENTEROCOCCUS FAECALIS SUSCEPTIBILITIES TO FOLLOW Performed at [facility], [address] [place], [place] [address]    Report Status PENDING  Incomplete    RADIOLOGY:  No results found.   Management plans discussed with the patient, family and they are in agreement.  CODE STATUS:     Code Status Orders  (From admission, onward)        Start     Ordered   10/09/17 1651  Do not attempt resuscitation (DNR)  Continuous    Question Answer Comment  In the event of cardiac or respiratory ARREST Do not call a "code blue"   In the event of cardiac or respiratory ARREST Do not perform Intubation, CPR, defibrillation or ACLS   In the event of cardiac or respiratory ARREST Use medication by any route, position, wound care, and other measures to relive pain and suffering. May use oxygen, suction and manual treatment of airway obstruction as needed for comfort.      10/09/17 1651    Code Status History    Date Active  Date Inactive Code Status Order ID Comments User Context   10/09/2017 16:51 10/09/2017 16:51 DNR [id number]  Adrian Saran, MD Inpatient   10/24/2015 00:12 10/28/2015 18:23 DNR [id number]  Oralia Manis, MD ED   02/27/2015 02:03 03/01/2015 17:38 DNR [id number]  Crissie Figures, MD Inpatient    Advance Directive Documentation     Most Recent Value  Type of Advance Directive  Out of facility DNR (pink MOST or yellow form)  Pre-existing out of facility DNR order (yellow form or pink MOST form)  Yellow form placed in chart (order not valid for inpatient use)  "MOST" Form in Place?  No data      TOTAL TIME TAKING CARE OF THIS PATIENT: 40 minutes.    Enedina Finner M.D on 10/11/2017 at 2:20 PM  Between 7am to 6pm - Pager - [phone] After 6pm go to [email or web address] - Social research officer, government  [facility] [place] [facility]  Office  [phone]  CC: Primary care physician; Marletta Lor, NP

## 2017-10-15 ENCOUNTER — Other Ambulatory Visit: Payer: Self-pay | Admitting: *Deleted

## 2017-10-15 LAB — URINE CULTURE: Culture: >=100000 — AB

## 2017-10-15 NOTE — Patient Outreach (Signed)
[facility] Care Management  10/15/2017  Alphonzo SeveranceMargie F Williams 1922-02-10 [id number]  Referral via EMMI-General Discharge-on APL-Day#1 10/14/2017; unfilled prescriptions  Per hx review patient was discharged to home with  [facility].   Telephone call to [facility] [place]; spoke with Britta MccreedyBarbara who advised that case was opened today under hospice care.  Plan: Will send to care management assistant to close due to patient enrolled in an external program.  Colleen CanLinda Rael Tilly, RN BSN CCM Care Management Coordinator [facility]  [phone]

## 2018-02-11 DEATH — deceased

## 2019-05-14 IMAGING — DX DG CHEST 1V PORT
1 series · 1 of 1 positions shown · non-contrast
Comparison: Chest x-ray dated October 23, 2015.

CLINICAL DATA: Weakness.  Evaluate for pneumonia.

EXAM:
PORTABLE CHEST 1 VIEW

[chest ap]
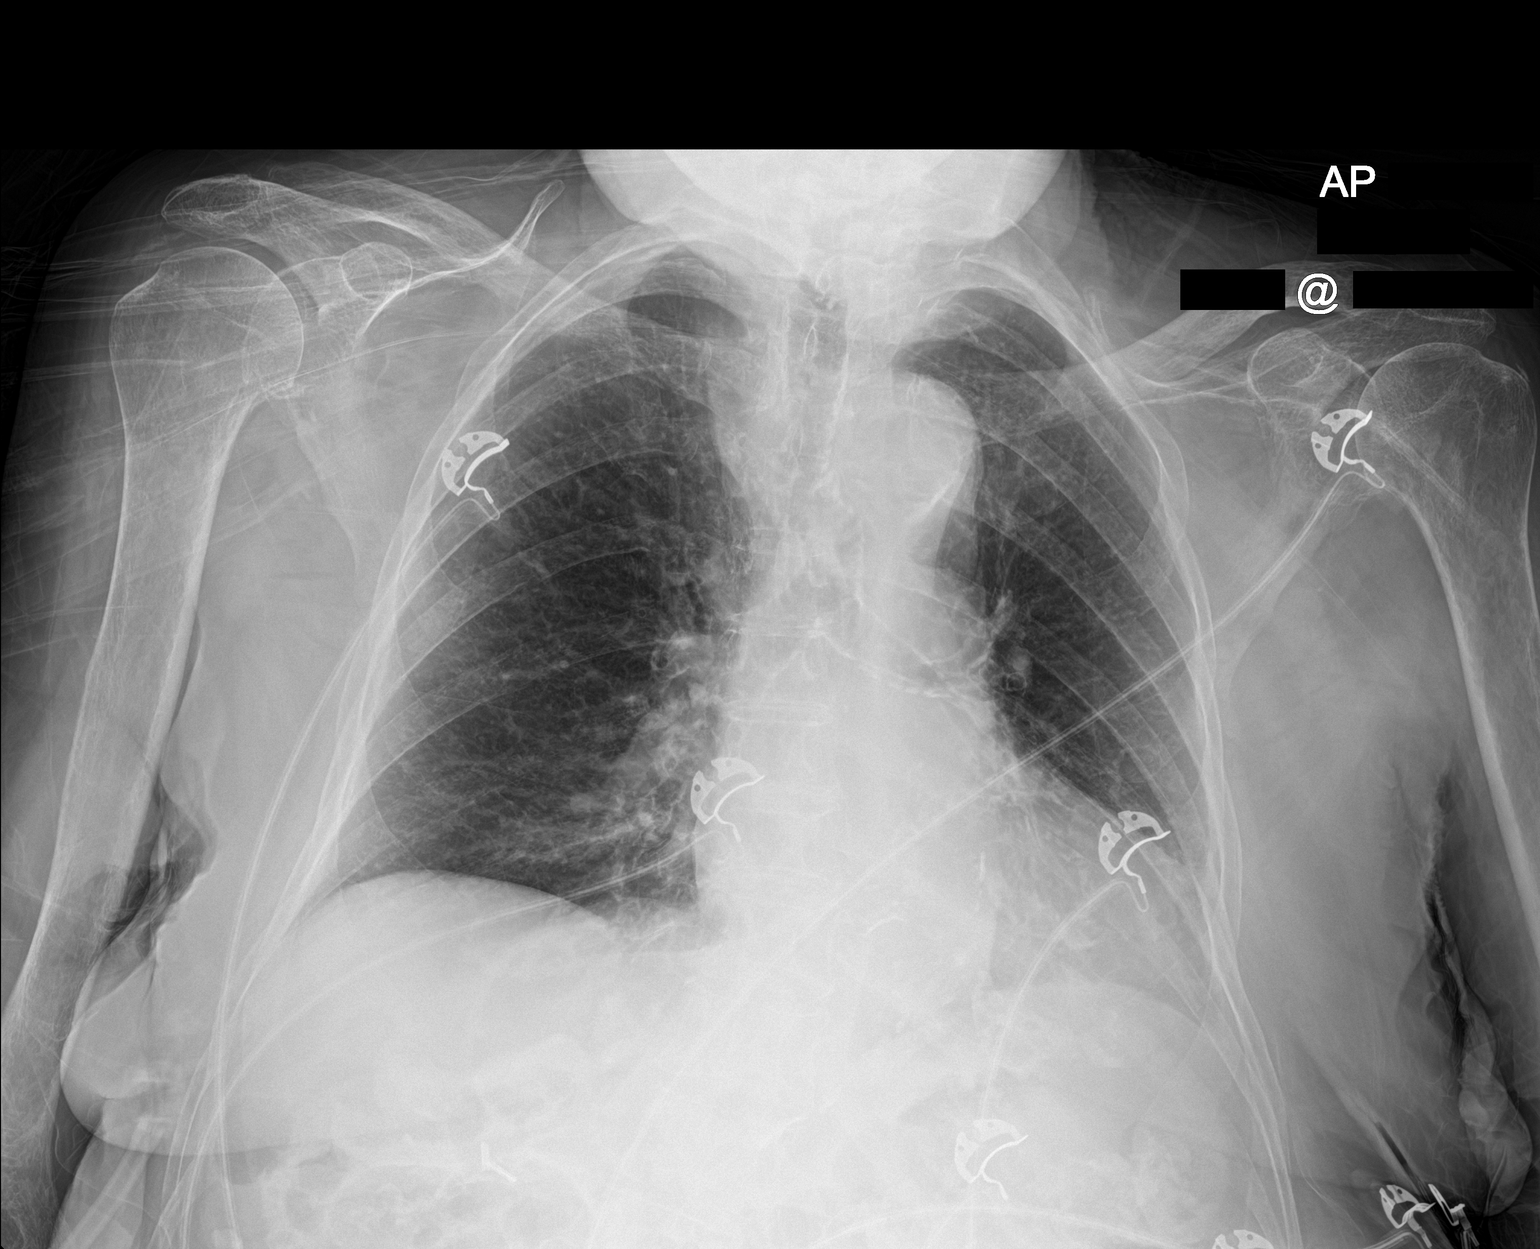

[1 of 1 positions shown; findings below may reference images not displayed]

FINDINGS: The cardiomediastinal silhouette is normal in size. Normal pulmonary
vascularity. Atherosclerotic calcification of the aortic arch. No
focal consolidation, pleural effusion, or pneumothorax. No acute
osseous abnormality.
IMPRESSION: No active disease.
